# Patient Record
Sex: Male | Born: 1965 | Hispanic: No | Marital: Single | Smoking: Former smoker
Health system: Southern US, Community
[De-identification: ages and names within clinical notes are randomized; demographics above are authoritative.]

## PROBLEM LIST (undated history)

## (undated) DIAGNOSIS — I255 Ischemic cardiomyopathy: Secondary | ICD-10-CM

## (undated) DIAGNOSIS — E785 Hyperlipidemia, unspecified: Secondary | ICD-10-CM

## (undated) DIAGNOSIS — I2119 ST elevation (STEMI) myocardial infarction involving other coronary artery of inferior wall: Secondary | ICD-10-CM

## (undated) DIAGNOSIS — I2510 Atherosclerotic heart disease of native coronary artery without angina pectoris: Secondary | ICD-10-CM

## (undated) DIAGNOSIS — Z87891 Personal history of nicotine dependence: Secondary | ICD-10-CM

## (undated) HISTORY — DX: Personal history of nicotine dependence: Z87.891

## (undated) HISTORY — PX: NO PAST SURGERIES: SHX2092

## (undated) HISTORY — DX: Ischemic cardiomyopathy: I25.5

## (undated) HISTORY — DX: Hyperlipidemia, unspecified: E78.5

---

## 2009-10-27 ENCOUNTER — Emergency Department (HOSPITAL_COMMUNITY): Admission: EM | Admit: 2009-10-27 | Discharge: 2009-10-28 | Payer: Self-pay | Admitting: Emergency Medicine

## 2011-01-29 LAB — DIFFERENTIAL
Basophils Absolute: 0.3 10*3/uL — ABNORMAL HIGH (ref 0.0–0.1)
Basophils Relative: 2 % — ABNORMAL HIGH (ref 0–1)
Monocytes Absolute: 0.3 10*3/uL (ref 0.1–1.0)
Neutro Abs: 13.1 10*3/uL — ABNORMAL HIGH (ref 1.7–7.7)
Neutrophils Relative %: 89 % — ABNORMAL HIGH (ref 43–77)

## 2011-01-29 LAB — COMPREHENSIVE METABOLIC PANEL
BUN: 21 mg/dL (ref 6–23)
CO2: 22 mEq/L (ref 19–32)
Chloride: 108 mEq/L (ref 96–112)
Creatinine, Ser: 1.23 mg/dL (ref 0.4–1.5)
GFR calc non Af Amer: >60 mL/min (ref >60)
Glucose, Bld: 137 mg/dL — ABNORMAL HIGH (ref 70–99)
Total Bilirubin: 0.7 mg/dL (ref 0.3–1.2)

## 2011-01-29 LAB — POCT CARDIAC MARKERS
CKMB, poc: <1 ng/mL — ABNORMAL LOW (ref 1.0–8.0)
CKMB, poc: <1 ng/mL — ABNORMAL LOW (ref 1.0–8.0)
Myoglobin, poc: 84.3 ng/mL (ref 12–200)
Myoglobin, poc: 95.5 ng/mL (ref 12–200)
Troponin i, poc: <0.05 ng/mL (ref 0.00–0.09)
Troponin i, poc: <0.05 ng/mL (ref 0.00–0.09)

## 2011-01-29 LAB — URINALYSIS, ROUTINE W REFLEX MICROSCOPIC
Glucose, UA: NEGATIVE mg/dL
Ketones, ur: NEGATIVE mg/dL
pH: 5.5 (ref 5.0–8.0)

## 2011-01-29 LAB — CBC
HCT: 47.6 % (ref 39.0–52.0)
Hemoglobin: 16 g/dL (ref 13.0–17.0)
WBC: 14.9 10*3/uL — ABNORMAL HIGH (ref 4.0–10.5)

## 2015-08-31 ENCOUNTER — Encounter (HOSPITAL_COMMUNITY): Payer: Self-pay | Admitting: Cardiovascular Disease

## 2015-08-31 ENCOUNTER — Inpatient Hospital Stay (HOSPITAL_COMMUNITY)
Admission: RE | Admit: 2015-08-31 | Discharge: 2015-09-03 | DRG: 247 | Disposition: A | Discharge status: Home / Self Care | Payer: PRIVATE HEALTH INSURANCE | Source: Other Acute Inpatient Hospital | Attending: Cardiovascular Disease | Admitting: Cardiovascular Disease

## 2015-08-31 ENCOUNTER — Encounter (HOSPITAL_COMMUNITY)
Admission: RE | Disposition: A | Discharge status: Home / Self Care | Payer: PRIVATE HEALTH INSURANCE | Source: Other Acute Inpatient Hospital | Attending: Cardiovascular Disease

## 2015-08-31 ENCOUNTER — Inpatient Hospital Stay (HOSPITAL_COMMUNITY): Payer: PRIVATE HEALTH INSURANCE

## 2015-08-31 DIAGNOSIS — I213 ST elevation (STEMI) myocardial infarction of unspecified site: Secondary | ICD-10-CM | POA: Diagnosis present

## 2015-08-31 DIAGNOSIS — I472 Ventricular tachycardia: Secondary | ICD-10-CM | POA: Diagnosis not present

## 2015-08-31 DIAGNOSIS — E785 Hyperlipidemia, unspecified: Secondary | ICD-10-CM | POA: Diagnosis present

## 2015-08-31 DIAGNOSIS — I2111 ST elevation (STEMI) myocardial infarction involving right coronary artery: Secondary | ICD-10-CM | POA: Diagnosis not present

## 2015-08-31 DIAGNOSIS — I255 Ischemic cardiomyopathy: Secondary | ICD-10-CM | POA: Diagnosis present

## 2015-08-31 DIAGNOSIS — W19XXXA Unspecified fall, initial encounter: Secondary | ICD-10-CM | POA: Diagnosis present

## 2015-08-31 DIAGNOSIS — I252 Old myocardial infarction: Secondary | ICD-10-CM

## 2015-08-31 DIAGNOSIS — Z955 Presence of coronary angioplasty implant and graft: Secondary | ICD-10-CM

## 2015-08-31 DIAGNOSIS — I25110 Atherosclerotic heart disease of native coronary artery with unstable angina pectoris: Secondary | ICD-10-CM | POA: Diagnosis not present

## 2015-08-31 DIAGNOSIS — I2119 ST elevation (STEMI) myocardial infarction involving other coronary artery of inferior wall: Secondary | ICD-10-CM | POA: Diagnosis present

## 2015-08-31 DIAGNOSIS — Z23 Encounter for immunization: Secondary | ICD-10-CM | POA: Diagnosis not present

## 2015-08-31 DIAGNOSIS — R51 Headache: Secondary | ICD-10-CM

## 2015-08-31 DIAGNOSIS — I2510 Atherosclerotic heart disease of native coronary artery without angina pectoris: Secondary | ICD-10-CM

## 2015-08-31 DIAGNOSIS — I200 Unstable angina: Secondary | ICD-10-CM

## 2015-08-31 DIAGNOSIS — Z87891 Personal history of nicotine dependence: Secondary | ICD-10-CM

## 2015-08-31 DIAGNOSIS — R55 Syncope and collapse: Secondary | ICD-10-CM | POA: Diagnosis present

## 2015-08-31 DIAGNOSIS — R519 Headache, unspecified: Secondary | ICD-10-CM

## 2015-08-31 HISTORY — PX: CARDIAC CATHETERIZATION: SHX172

## 2015-08-31 HISTORY — DX: Atherosclerotic heart disease of native coronary artery without angina pectoris: I25.10

## 2015-08-31 HISTORY — DX: ST elevation (STEMI) myocardial infarction involving other coronary artery of inferior wall: I21.19

## 2015-08-31 LAB — COMPREHENSIVE METABOLIC PANEL
ALBUMIN: 3.5 g/dL (ref 3.5–5.0)
ALK PHOS: 70 U/L (ref 38–126)
ALT: 26 U/L (ref 17–63)
ANION GAP: 10 (ref 5–15)
AST: 39 U/L (ref 15–41)
BILIRUBIN TOTAL: 0.5 mg/dL (ref 0.3–1.2)
BUN: 14 mg/dL (ref 6–20)
CALCIUM: 8.5 mg/dL — AB (ref 8.9–10.3)
CO2: 24 mmol/L (ref 22–32)
CREATININE: 0.9 mg/dL (ref 0.61–1.24)
Chloride: 106 mmol/L (ref 101–111)
GFR calc non Af Amer: >60 mL/min (ref >60)
GLUCOSE: 121 mg/dL — AB (ref 65–99)
Potassium: 3.9 mmol/L (ref 3.5–5.1)
SODIUM: 140 mmol/L (ref 135–145)
Total Protein: 6.6 g/dL (ref 6.5–8.1)

## 2015-08-31 LAB — POCT I-STAT, CHEM 8
BUN: 17 mg/dL (ref 6–20)
CALCIUM ION: 1.15 mmol/L (ref 1.12–1.23)
Chloride: 100 mmol/L — ABNORMAL LOW (ref 101–111)
Creatinine, Ser: 0.8 mg/dL (ref 0.61–1.24)
GLUCOSE: 103 mg/dL — AB (ref 65–99)
HCT: 38 % — ABNORMAL LOW (ref 39.0–52.0)
Hemoglobin: 12.9 g/dL — ABNORMAL LOW (ref 13.0–17.0)
POTASSIUM: 3 mmol/L — AB (ref 3.5–5.1)
SODIUM: 138 mmol/L (ref 135–145)
TCO2: 23 mmol/L (ref 0–100)

## 2015-08-31 LAB — CBC WITH DIFFERENTIAL/PLATELET
Basophils Absolute: 0 10*3/uL (ref 0.0–0.1)
Basophils Relative: 0 %
Eosinophils Absolute: 0.1 10*3/uL (ref 0.0–0.7)
Eosinophils Relative: 1 %
HEMATOCRIT: 43.3 % (ref 39.0–52.0)
HEMOGLOBIN: 14 g/dL (ref 13.0–17.0)
LYMPHS ABS: 1.7 10*3/uL (ref 0.7–4.0)
Lymphocytes Relative: 11 %
MCH: 26.3 pg (ref 26.0–34.0)
MCHC: 32.3 g/dL (ref 30.0–36.0)
MCV: 81.4 fL (ref 78.0–100.0)
MONOS PCT: 3 %
Monocytes Absolute: 0.5 10*3/uL (ref 0.1–1.0)
NEUTROS ABS: 12.5 10*3/uL — AB (ref 1.7–7.7)
NEUTROS PCT: 85 %
Platelets: 200 10*3/uL (ref 150–400)
RBC: 5.32 MIL/uL (ref 4.22–5.81)
RDW: 13.2 % (ref 11.5–15.5)
WBC: 14.8 10*3/uL — ABNORMAL HIGH (ref 4.0–10.5)

## 2015-08-31 LAB — TSH: TSH: 0.919 u[IU]/mL (ref 0.350–4.500)

## 2015-08-31 LAB — BRAIN NATRIURETIC PEPTIDE: B Natriuretic Peptide: 9.7 pg/mL (ref 0.0–100.0)

## 2015-08-31 LAB — POCT ACTIVATED CLOTTING TIME: ACTIVATED CLOTTING TIME: 706 s

## 2015-08-31 LAB — TROPONIN I
Troponin I: 4.52 ng/mL (ref <0.031)
Troponin I: 19.22 ng/mL (ref <0.031)

## 2015-08-31 LAB — MRSA PCR SCREENING: MRSA by PCR: NEGATIVE

## 2015-08-31 SURGERY — LEFT HEART CATH AND CORONARY ANGIOGRAPHY
Anesthesia: LOCAL

## 2015-08-31 MED ORDER — MIDAZOLAM HCL 2 MG/2ML IJ SOLN
INTRAMUSCULAR | Status: AC
  Filled 2015-08-31: qty 4

## 2015-08-31 MED ORDER — SODIUM CHLORIDE 0.9 % IJ SOLN
3.0000 mL | Freq: Two times a day (BID) | INTRAMUSCULAR | Status: DC
  Administered 2015-08-31 – 2015-09-03 (×6): 3 mL via INTRAVENOUS

## 2015-08-31 MED ORDER — NITROGLYCERIN 0.4 MG SL SUBL
0.4000 mg | SUBLINGUAL_TABLET | SUBLINGUAL | Status: DC | PRN
  Administered 2015-09-02 (×2): 0.4 mg via SUBLINGUAL
  Filled 2015-08-31 (×2): qty 1

## 2015-08-31 MED ORDER — HEPARIN SODIUM (PORCINE) 5000 UNIT/ML IJ SOLN
5000.0000 [IU] | Freq: Three times a day (TID) | INTRAMUSCULAR | Status: DC
  Administered 2015-09-01 – 2015-09-03 (×5): 5000 [IU] via SUBCUTANEOUS
  Filled 2015-08-31 (×5): qty 1

## 2015-08-31 MED ORDER — ATORVASTATIN CALCIUM 80 MG PO TABS
80.0000 mg | ORAL_TABLET | Freq: Every day | ORAL | Status: DC
  Administered 2015-08-31 – 2015-09-02 (×3): 80 mg via ORAL
  Filled 2015-08-31 (×3): qty 1

## 2015-08-31 MED ORDER — DOPAMINE-DEXTROSE 3.2-5 MG/ML-% IV SOLN
2.0000 ug/kg/min | INTRAVENOUS | Status: DC
  Administered 2015-08-31: 5 ug/kg/min via INTRAVENOUS

## 2015-08-31 MED ORDER — HEPARIN (PORCINE) IN NACL 2-0.9 UNIT/ML-% IJ SOLN
INTRAMUSCULAR | Status: AC
  Filled 2015-08-31: qty 1000

## 2015-08-31 MED ORDER — SODIUM CHLORIDE 0.9 % IV SOLN
250.0000 mg | INTRAVENOUS | Status: DC | PRN
  Administered 2015-08-31: 0.25 mg/kg/h via INTRAVENOUS

## 2015-08-31 MED ORDER — ONDANSETRON HCL 4 MG/2ML IJ SOLN: 4.0000 mg | Freq: Four times a day (QID) | INTRAMUSCULAR | Status: DC | PRN

## 2015-08-31 MED ORDER — SODIUM CHLORIDE 0.9 % IJ SOLN: 3.0000 mL | INTRAMUSCULAR | Status: DC | PRN

## 2015-08-31 MED ORDER — MORPHINE SULFATE (PF) 2 MG/ML IV SOLN
2.0000 mg | INTRAVENOUS | Status: DC | PRN
  Administered 2015-09-02: 2 mg via INTRAVENOUS
  Filled 2015-08-31: qty 1

## 2015-08-31 MED ORDER — METOPROLOL TARTRATE 12.5 MG HALF TABLET
12.5000 mg | ORAL_TABLET | Freq: Two times a day (BID) | ORAL | Status: DC
  Administered 2015-08-31 – 2015-09-03 (×6): 12.5 mg via ORAL
  Filled 2015-08-31 (×6): qty 1

## 2015-08-31 MED ORDER — SODIUM CHLORIDE 0.9 % IV SOLN: 250.0000 mL | INTRAVENOUS | Status: DC | PRN

## 2015-08-31 MED ORDER — TIROFIBAN HCL IN NACL 5-0.9 MG/100ML-% IV SOLN
INTRAVENOUS | Status: AC
  Filled 2015-08-31: qty 100

## 2015-08-31 MED ORDER — ATROPINE SULFATE 0.1 MG/ML IJ SOLN
INTRAMUSCULAR | Status: AC
  Filled 2015-08-31: qty 10

## 2015-08-31 MED ORDER — ATROPINE SULFATE 0.1 MG/ML IJ SOLN
INTRAMUSCULAR | Status: DC | PRN
  Administered 2015-08-31: 1 mg via INTRAVENOUS

## 2015-08-31 MED ORDER — TICAGRELOR 90 MG PO TABS
90.0000 mg | ORAL_TABLET | Freq: Two times a day (BID) | ORAL | Status: DC
  Administered 2015-08-31 – 2015-09-03 (×6): 90 mg via ORAL
  Filled 2015-08-31 (×6): qty 1

## 2015-08-31 MED ORDER — ACETAMINOPHEN 325 MG PO TABS
650.0000 mg | ORAL_TABLET | ORAL | Status: DC | PRN
  Administered 2015-09-02: 650 mg via ORAL
  Filled 2015-08-31: qty 2

## 2015-08-31 MED ORDER — FENTANYL CITRATE (PF) 100 MCG/2ML IJ SOLN
INTRAMUSCULAR | Status: DC | PRN
  Administered 2015-08-31: 25 ug via INTRAVENOUS

## 2015-08-31 MED ORDER — TICAGRELOR 90 MG PO TABS
ORAL_TABLET | ORAL | Status: AC
  Filled 2015-08-31: qty 2

## 2015-08-31 MED ORDER — TIROFIBAN HCL IN NACL 5-0.9 MG/100ML-% IV SOLN
INTRAVENOUS | Status: DC | PRN
  Administered 2015-08-31: 0.15 ug/kg/min via INTRAVENOUS

## 2015-08-31 MED ORDER — VERAPAMIL HCL 2.5 MG/ML IV SOLN
INTRAVENOUS | Status: AC
  Filled 2015-08-31: qty 2

## 2015-08-31 MED ORDER — ACETAMINOPHEN 325 MG PO TABS: 650.0000 mg | ORAL_TABLET | ORAL | Status: DC | PRN

## 2015-08-31 MED ORDER — INFLUENZA VAC SPLIT QUAD 0.5 ML IM SUSY
0.5000 mL | PREFILLED_SYRINGE | INTRAMUSCULAR | Status: AC
Start: 2015-09-01 — End: 2015-09-02
  Administered 2015-09-02: 0.5 mL via INTRAMUSCULAR
  Filled 2015-08-31: qty 0.5

## 2015-08-31 MED ORDER — HEPARIN SODIUM (PORCINE) 1000 UNIT/ML IJ SOLN
INTRAMUSCULAR | Status: DC | PRN
  Administered 2015-08-31: 4000 [IU] via INTRAVENOUS

## 2015-08-31 MED ORDER — TICAGRELOR 90 MG PO TABS
ORAL_TABLET | ORAL | Status: DC | PRN
  Administered 2015-08-31: 180 mg via ORAL

## 2015-08-31 MED ORDER — FENTANYL CITRATE (PF) 100 MCG/2ML IJ SOLN
INTRAMUSCULAR | Status: AC
  Filled 2015-08-31: qty 4

## 2015-08-31 MED ORDER — DOPAMINE-DEXTROSE 3.2-5 MG/ML-% IV SOLN
INTRAVENOUS | Status: AC
  Filled 2015-08-31: qty 250

## 2015-08-31 MED ORDER — OXYCODONE-ACETAMINOPHEN 5-325 MG PO TABS: 1.0000 | ORAL_TABLET | ORAL | Status: DC | PRN

## 2015-08-31 MED ORDER — MIDAZOLAM HCL 2 MG/2ML IJ SOLN
INTRAMUSCULAR | Status: DC | PRN
  Administered 2015-08-31: 2 mg via INTRAVENOUS

## 2015-08-31 MED ORDER — DOPAMINE-DEXTROSE 3.2-5 MG/ML-% IV SOLN
INTRAVENOUS | Status: DC | PRN
  Administered 2015-08-31: 5 ug/kg/min via INTRAVENOUS

## 2015-08-31 MED ORDER — LIDOCAINE HCL (PF) 1 % IJ SOLN
INTRAMUSCULAR | Status: AC
  Filled 2015-08-31: qty 30

## 2015-08-31 MED ORDER — NITROGLYCERIN 1 MG/10 ML FOR IR/CATH LAB
INTRA_ARTERIAL | Status: AC
  Filled 2015-08-31: qty 10

## 2015-08-31 MED ORDER — SODIUM CHLORIDE 0.9 % IV SOLN
INTRAVENOUS | Status: AC
  Administered 2015-08-31: 16:00:00 via INTRAVENOUS

## 2015-08-31 MED ORDER — ASPIRIN 81 MG PO CHEW
81.0000 mg | CHEWABLE_TABLET | Freq: Every day | ORAL | Status: DC
  Administered 2015-09-01 – 2015-09-03 (×3): 81 mg via ORAL
  Filled 2015-08-31 (×2): qty 1

## 2015-08-31 MED ORDER — HEPARIN SODIUM (PORCINE) 1000 UNIT/ML IJ SOLN
INTRAMUSCULAR | Status: AC
  Filled 2015-08-31: qty 1

## 2015-08-31 SURGICAL SUPPLY — 18 items
BALLN EUPHORA RX 2.25X12 (BALLOONS) ×2
BALLOON EUPHORA RX 2.25X12 (BALLOONS) ×1 IMPLANT
CATH EXTRAC PRONTO 5.5F 138CM (CATHETERS) ×2 IMPLANT
CATH INFINITI 5 FR JL3.5 (CATHETERS) ×2 IMPLANT
CATH INFINITI 5FR ANG PIGTAIL (CATHETERS) ×2 IMPLANT
CATH INFINITI JR4 5F (CATHETERS) ×2 IMPLANT
CATH VISTA GUIDE 6FR JR4 (CATHETERS) ×2 IMPLANT
DEVICE RAD COMP TR BAND LRG (VASCULAR PRODUCTS) ×2 IMPLANT
GLIDESHEATH SLEND SS 6F .021 (SHEATH) ×2 IMPLANT
KIT ENCORE 26 ADVANTAGE (KITS) ×2 IMPLANT
KIT HEART LEFT (KITS) ×2 IMPLANT
PACK CARDIAC CATHETERIZATION (CUSTOM PROCEDURE TRAY) ×2 IMPLANT
STENT RESOLUTE INTEG 4.0X18 (Permanent Stent) ×2 IMPLANT
SYR MEDRAD MARK V 150ML (SYRINGE) ×2 IMPLANT
TRANSDUCER W/STOPCOCK (MISCELLANEOUS) ×2 IMPLANT
TUBING CIL FLEX 10 FLL-RA (TUBING) ×2 IMPLANT
WIRE COUGAR XT STRL 190CM (WIRE) ×2 IMPLANT
WIRE SAFE-T 1.5MM-J .035X260CM (WIRE) ×2 IMPLANT

## 2015-08-31 NOTE — H&P (Addendum)
Patient ID: Caleb [name] MRN: [id number] DOB/AGE: 1966/10/24 49 y.o. Admit date: 08/31/2015  Primary Care Physician:No primary care provider on file. Primary Cardiologist: Unknown   HPI: Caleb [name] is a 49 year old [place] male with no known past medical history who presented to the ED as code STEMI. History was collected primarily from EMS and through his co-worker as an interpreter was unavailable. Per EMS, he complained of chest pain before sustaining a brief syncopal episode at work today and hit his head on a steel plate. He reported chest pain and some L arm pain. In the ambulance, he received morphine , NG x 3, ASA  x 4, and 4L O2. Per his friend, he had a prior MI in the past though unclear where he was hospitalized but no ongoing smoking/alcohol use or current medications. Unknown family history of cardiac disease. EKG on admission was notable for ST elevations in the inferior leads.   Post-cath, history was collected with the aid of an interpreter. He reports having ongoing chest pain since Sunday which was made worse with exertion, particularly at work, and relieved with rest. This pain was also associated with nausea and dizziness. He was in the process of collecting something off the machine when his pain worsened to the point where he fell down though cannot recall if he hit his head or not. He denies prior history of this pain or prior MI. He denies any prior surgeries or hospitalizations. He has lived in the [place] since 2005 and used to smoke cigarettes for 13-14 years and drink occasionally before foregoing it all for his faith. His family recently moved a few months ago from [place].   No past medical history on file.  No past surgical history on file.  No family history on file.   Social History   Social History  . Marital Status: Single    Spouse Name: N/A  . Number of Children: N/A  . Years of Education: N/A   Occupational History  . Not on file.   Social  History Main Topics  . Smoking status: Not on file  . Smokeless tobacco: Not on file  . Alcohol Use: Not on file  . Drug Use: Not on file  . Sexual Activity: Not on file   Other Topics Concern  . Not on file   Social History Narrative  . No narrative on file     Prior to Admission medications   Not on File    HOSPITAL MEDICATIONS:   . bivalirudin (ANGIOMAX) infusion 5 mg/mL 0.25 mg/kg/hr (08/31/15 1413)  . tirofiban 0.075 mcg/kg/min (08/31/15 1425)    ROS:  General: no fevers/chills/night sweats/weight loss Eyes: no blurry vision, diplopia, or amaurosis ENT: no sore throat or hearing loss Resp: no cough, wheezing, or hemoptysis CV: no edema or palpitations GI: nausea since Sunday though no abdominal pain, vomiting, diarrhea, or constipation GU: no dysuria, frequency, or hematuria Skin: no rash Neuro: dizziness, no headache, numbness, tingling, or weakness of extremities Musculoskeletal: no joint pain or swelling Heme: no bleeding, DVT, or easy bruising Endo: no polydipsia or polyuria  Physical Exam: SpO2 98 %.    Pt appears alert and oriented, WD, WN, in no distress.  HEENT: normal Neck: JVP normal. Carotid upstrokes normal without bruits. No thyromegaly. Lungs: equal expansion, clear bilaterally CV: Apex is discrete and nondisplaced, RRR without murmur or gallop Abd: soft, NT, +BS, no bruit, no hepatosplenomegaly Back: no CVA tenderness Ext: no C/C/E  Femoral pulses 2+= without bruits        DP/PT pulses intact and = Skin: warm and dry without rash Neuro: CNII-XII intact             Strength intact = bilaterally   EKG: ST elevations in II, III, avF  ASSESSMENT AND PLAN: 49 year old [place] male who presented with inferior STEMI. Plan for emergent cath and dispo pending results.  SignedHeywood Iles: Rosland Riding 08/31/2015, 2:26 PM  Patient seen, examined. Available data reviewed. Agree with findings, assessment, and plan as outlined by Dr Allena KatzPatel. The  patient is having an inferior wall STEMI and is brought directly to the cardiac catheterization lab for catheterization and PCI. Family history, social history, past medical history, and review of systems are unobtainable at this time because of language barrier. The patient speaks no English at all. There is a friend here who also speaks very limited [place]. We are working on getting a Nurse, learning disabilitytranslator to help with his evaluation.  Tonny BollmanMichael Cooper, M.D. 08/31/2015 6:32 PM

## 2015-09-01 ENCOUNTER — Encounter (HOSPITAL_COMMUNITY): Payer: Self-pay | Admitting: Cardiovascular Disease

## 2015-09-01 LAB — BASIC METABOLIC PANEL
ANION GAP: 8 (ref 5–15)
BUN: 14 mg/dL (ref 6–20)
CALCIUM: 8.9 mg/dL (ref 8.9–10.3)
CO2: 26 mmol/L (ref 22–32)
Chloride: 105 mmol/L (ref 101–111)
Creatinine, Ser: 0.92 mg/dL (ref 0.61–1.24)
GFR calc Af Amer: >60 mL/min (ref >60)
GLUCOSE: 100 mg/dL — AB (ref 65–99)
POTASSIUM: 3.8 mmol/L (ref 3.5–5.1)
SODIUM: 139 mmol/L (ref 135–145)

## 2015-09-01 LAB — CBC
HEMATOCRIT: 41.6 % (ref 39.0–52.0)
HEMOGLOBIN: 13.2 g/dL (ref 13.0–17.0)
MCH: 26.1 pg (ref 26.0–34.0)
MCHC: 31.7 g/dL (ref 30.0–36.0)
MCV: 82.2 fL (ref 78.0–100.0)
Platelets: 188 10*3/uL (ref 150–400)
RBC: 5.06 MIL/uL (ref 4.22–5.81)
RDW: 13.6 % (ref 11.5–15.5)
WBC: 9.5 10*3/uL (ref 4.0–10.5)

## 2015-09-01 LAB — LIPID PANEL
CHOLESTEROL: 262 mg/dL — AB (ref 0–200)
HDL: 37 mg/dL — AB (ref >40)
LDL Cholesterol: 194 mg/dL — ABNORMAL HIGH (ref 0–99)
TRIGLYCERIDES: 155 mg/dL — AB (ref <150)
Total CHOL/HDL Ratio: 7.1 RATIO
VLDL: 31 mg/dL (ref 0–40)

## 2015-09-01 LAB — HEMOGLOBIN A1C
Hgb A1c MFr Bld: 5.2 % (ref 4.8–5.6)
Mean Plasma Glucose: 103 mg/dL

## 2015-09-01 LAB — TROPONIN I: Troponin I: 16.16 ng/mL (ref <0.031)

## 2015-09-01 MED ORDER — PNEUMOCOCCAL VAC POLYVALENT 25 MCG/0.5ML IJ INJ: 0.5000 mL | INJECTION | INTRAMUSCULAR | Status: DC

## 2015-09-01 MED ORDER — POTASSIUM CHLORIDE ER 10 MEQ PO TBCR
20.0000 meq | EXTENDED_RELEASE_TABLET | Freq: Once | ORAL | Status: AC
  Administered 2015-09-01: 20 meq via ORAL
  Filled 2015-09-01 (×2): qty 2

## 2015-09-01 NOTE — Progress Notes (Signed)
Dr. Allena KatzPatel at bedside with interpreter, [facility]. Reviewed and updated H&P. Consent signed and confirmed via interpreter by Dr. Allena KatzPatel and Hillery Jacksebecca Cartha Rotert, RN. All questions and concerns addressed and answered appropriately. Dr. Duke Salviaandolph at bedside also confirming cath procedure for tomorrow 09/02/15 (see signed consent).

## 2015-09-01 NOTE — Progress Notes (Signed)
Attempted several times to get in touch with interpreter with the number that was listed in chart, unable to get a hold of them, the line was busy.  Tried calling [place] interpreters to reach via phone they did not have specific dialect of pt. Will continue to try and get in touch with interpreter.

## 2015-09-01 NOTE — Care Management Note (Signed)
Case Management Note  Patient Details  Name: Caleb [name] MRN: [id number] Date of Birth: 11/14/1965  Subjective/Objective:     Adm w mi               Action/Plan:lives w fam   Expected Discharge Date:                  Expected Discharge Plan:     In-House Referral:     Discharge planning Services     Post Acute Care Choice:    Choice offered to:     DME Arranged:    DME Agency:     HH Arranged:    HH Agency:     Status of Service:     Medicare Important Message Given:    Date Medicare IM Given:    Medicare IM give by:    Date Additional Medicare IM Given:    Additional Medicare Important Message give by:     If discussed at Long Length of Stay Meetings, dates discussed:    Additional Comments:gave pt 30day free and copay assist card for brilinta.  Hanley Haysowell, Hernando Reali T, RN 09/01/2015, 9:55 AM

## 2015-09-01 NOTE — Progress Notes (Signed)
    Subjective:  Wife present at bedside. No interpreter was available to ask questions though denies any pain when asked.   Objective:  Vital Signs in the last 24 hours: Temp:  [98.1 F (36.7 C)-98.8 F (37.1 C)] 98.4 F (36.9 C) (11/03 0727) Pulse Rate:  [54-96] 65 (11/03 0800) Resp:  [8-13] 10 (11/03 0800) BP: (85-172)/(63-116) 114/82 mmHg (11/03 0800) SpO2:  [98 %-100 %] 100 % (11/03 0800) Weight:  [130 lb 8.2 oz (59.2 kg)] 130 lb 8.2 oz (59.2 kg) (11/02 1500)  Intake/Output from previous day: 11/02 0701 - 11/03 0700 In: 372.2 [P.O.:60; I.V.:312.2] Out: -   Physical Exam: Pt is alert and oriented, NAD, 2L O2 by Morse HEENT: normal Neck: JVP - normal Lungs: CTA bilaterally CV: RRR without murmur or gallop Abd: soft, NT, Positive BS, no hepatomegaly Ext: no C/C/E, distal pulses intact and equal Skin: warm/dry no rash   Lab Results:  Recent Labs  08/31/15 1608 09/01/15 0312  WBC 14.8* 9.5  HGB 14.0 13.2  PLT 200 188    Recent Labs  08/31/15 1608 09/01/15 0312  NA 140 139  K 3.9 3.8  CL 106 105  CO2 24 26  GLUCOSE 121* 100*  BUN 14 14  CREATININE 0.90 0.92    Recent Labs  08/31/15 2052 09/01/15 0312  TROPONINI 19.22* 16.16*    Cardiac Studies: Procedures    Coronary Stent Intervention   Left Heart Cath and Coronary Angiography      Conclusion     Prox LAD to Mid LAD lesion, 90% stenosed.  Dist LAD lesion, 70% stenosed.  1st Mrg lesion, 90% stenosed.  The left ventricular systolic function is normal.  Dist RCA lesion, 100% stenosed. Post intervention, there is a 0% residual stenosis.  1. Acute inferior MI secondary total occlusion of a large, dominant RCA, treated successfully with primary PCI  2. Severe mid LAD stenosis  3. Severe stenosis of a small obtuse marginal branch of the circumflex  4. Preserved/normal LV systolic function   Assessment/Plan:  49 year old [place] male who presented with inferior STEMI now s/p DES  to distal RCA found to have severe stenosis of LAD.   Inferior STEMI s/p DES to distal RCA: ST elevations of inferior leads on admission. Repeat EKG this AM with Q waves and T-wave inversions.  -Continue metoprolol 12.5mg  twice daily, ASA 81mg , Brillinta 90mg  -Plan for intervention of LAD on Friday, 11/5 -Repeat echo following intervention  Hyperlipidemia: Total cholesterol 262, LDL 194. -Continue Lipitor 80 mg.   Heywood Ilesushil Patel, PGY2 Internal Medicine Pager: [phone]

## 2015-09-02 ENCOUNTER — Inpatient Hospital Stay (HOSPITAL_COMMUNITY): Payer: PRIVATE HEALTH INSURANCE

## 2015-09-02 ENCOUNTER — Encounter (HOSPITAL_COMMUNITY): Payer: Self-pay | Admitting: Interventional Cardiology

## 2015-09-02 ENCOUNTER — Encounter (HOSPITAL_COMMUNITY)
Admission: RE | Disposition: A | Discharge status: Home / Self Care | Payer: Self-pay | Source: Other Acute Inpatient Hospital | Attending: Cardiovascular Disease

## 2015-09-02 DIAGNOSIS — I25110 Atherosclerotic heart disease of native coronary artery with unstable angina pectoris: Secondary | ICD-10-CM

## 2015-09-02 DIAGNOSIS — I2510 Atherosclerotic heart disease of native coronary artery without angina pectoris: Secondary | ICD-10-CM

## 2015-09-02 DIAGNOSIS — I200 Unstable angina: Secondary | ICD-10-CM

## 2015-09-02 HISTORY — PX: CARDIAC CATHETERIZATION: SHX172

## 2015-09-02 LAB — PROTIME-INR
INR: 1.03 (ref 0.00–1.49)
Prothrombin Time: 13.7 seconds (ref 11.6–15.2)

## 2015-09-02 LAB — CBC
HEMATOCRIT: 44.7 % (ref 39.0–52.0)
Hemoglobin: 14.4 g/dL (ref 13.0–17.0)
MCH: 26.7 pg (ref 26.0–34.0)
MCHC: 32.2 g/dL (ref 30.0–36.0)
MCV: 82.8 fL (ref 78.0–100.0)
PLATELETS: 213 10*3/uL (ref 150–400)
RBC: 5.4 MIL/uL (ref 4.22–5.81)
RDW: 13.9 % (ref 11.5–15.5)
WBC: 8.2 10*3/uL (ref 4.0–10.5)

## 2015-09-02 LAB — HEPATIC FUNCTION PANEL
ALK PHOS: 61 U/L (ref 38–126)
ALT: 26 U/L (ref 17–63)
AST: 45 U/L — ABNORMAL HIGH (ref 15–41)
Albumin: 3.1 g/dL — ABNORMAL LOW (ref 3.5–5.0)
TOTAL PROTEIN: 6.5 g/dL (ref 6.5–8.1)
Total Bilirubin: 1.1 mg/dL (ref 0.3–1.2)

## 2015-09-02 LAB — BASIC METABOLIC PANEL
Anion gap: 10 (ref 5–15)
BUN: 16 mg/dL (ref 6–20)
CHLORIDE: 104 mmol/L (ref 101–111)
CO2: 25 mmol/L (ref 22–32)
CREATININE: 0.98 mg/dL (ref 0.61–1.24)
Calcium: 8.9 mg/dL (ref 8.9–10.3)
GFR calc Af Amer: >60 mL/min (ref >60)
GFR calc non Af Amer: >60 mL/min (ref >60)
GLUCOSE: 84 mg/dL (ref 65–99)
POTASSIUM: 3.7 mmol/L (ref 3.5–5.1)
Sodium: 139 mmol/L (ref 135–145)

## 2015-09-02 SURGERY — CORONARY STENT INTERVENTION

## 2015-09-02 MED ORDER — SODIUM CHLORIDE 0.9 % WEIGHT BASED INFUSION
3.0000 mL/kg/h | INTRAVENOUS | Status: DC
  Administered 2015-09-02: 3 mL/kg/h via INTRAVENOUS

## 2015-09-02 MED ORDER — FAMOTIDINE IN NACL 20-0.9 MG/50ML-% IV SOLN
INTRAVENOUS | Status: AC
  Filled 2015-09-02: qty 50

## 2015-09-02 MED ORDER — SODIUM CHLORIDE 0.9 % WEIGHT BASED INFUSION
3.0000 mL/kg/h | INTRAVENOUS | Status: AC
  Administered 2015-09-02: 3 mL/kg/h via INTRAVENOUS

## 2015-09-02 MED ORDER — HEPARIN (PORCINE) IN NACL 2-0.9 UNIT/ML-% IJ SOLN
INTRAMUSCULAR | Status: AC
  Filled 2015-09-02: qty 1000

## 2015-09-02 MED ORDER — VERAPAMIL HCL 2.5 MG/ML IV SOLN
INTRAVENOUS | Status: AC
  Filled 2015-09-02: qty 2

## 2015-09-02 MED ORDER — TICAGRELOR 90 MG PO TABS: 90.0000 mg | ORAL_TABLET | Freq: Two times a day (BID) | ORAL | Status: DC

## 2015-09-02 MED ORDER — ASPIRIN 81 MG PO CHEW
81.0000 mg | CHEWABLE_TABLET | ORAL | Status: DC
  Filled 2015-09-02: qty 1

## 2015-09-02 MED ORDER — SODIUM CHLORIDE 0.9 % WEIGHT BASED INFUSION
1.0000 mL/kg/h | INTRAVENOUS | Status: DC
  Administered 2015-09-02: 1 mL via INTRAVENOUS

## 2015-09-02 MED ORDER — FENTANYL CITRATE (PF) 100 MCG/2ML IJ SOLN
INTRAMUSCULAR | Status: DC | PRN
  Administered 2015-09-02 (×2): 25 ug via INTRAVENOUS

## 2015-09-02 MED ORDER — HEPARIN (PORCINE) IN NACL 2-0.9 UNIT/ML-% IJ SOLN
INTRAMUSCULAR | Status: DC | PRN
  Administered 2015-09-02: 12:00:00

## 2015-09-02 MED ORDER — SODIUM CHLORIDE 0.9 % IJ SOLN
3.0000 mL | Freq: Two times a day (BID) | INTRAMUSCULAR | Status: DC
  Administered 2015-09-02 (×2): 3 mL via INTRAVENOUS

## 2015-09-02 MED ORDER — IOHEXOL 350 MG/ML SOLN
INTRAVENOUS | Status: DC | PRN
  Administered 2015-09-02: 75 mL via INTRA_ARTERIAL

## 2015-09-02 MED ORDER — SODIUM CHLORIDE 0.9 % IJ SOLN
3.0000 mL | Freq: Two times a day (BID) | INTRAMUSCULAR | Status: DC
  Administered 2015-09-02 – 2015-09-03 (×2): 3 mL via INTRAVENOUS

## 2015-09-02 MED ORDER — GI COCKTAIL ~~LOC~~
30.0000 mL | Freq: Two times a day (BID) | ORAL | Status: DC | PRN
  Administered 2015-09-02: 30 mL via ORAL
  Filled 2015-09-02: qty 30

## 2015-09-02 MED ORDER — LIDOCAINE HCL (PF) 1 % IJ SOLN
INTRAMUSCULAR | Status: AC
  Filled 2015-09-02: qty 30

## 2015-09-02 MED ORDER — NITROGLYCERIN 1 MG/10 ML FOR IR/CATH LAB
INTRA_ARTERIAL | Status: DC | PRN
  Administered 2015-09-02: 100 ug

## 2015-09-02 MED ORDER — SODIUM CHLORIDE 0.9 % IJ SOLN: 3.0000 mL | INTRAMUSCULAR | Status: DC | PRN

## 2015-09-02 MED ORDER — SODIUM CHLORIDE 0.9 % IV SOLN
250.0000 mL | INTRAVENOUS | Status: DC | PRN
Start: 2015-09-02 — End: 2015-09-03

## 2015-09-02 MED ORDER — HEPARIN (PORCINE) IN NACL 2-0.9 UNIT/ML-% IJ SOLN
INTRAMUSCULAR | Status: DC | PRN
  Administered 2015-09-02: 12:00:00 via INTRA_ARTERIAL

## 2015-09-02 MED ORDER — LIDOCAINE HCL (PF) 1 % IJ SOLN
INTRAMUSCULAR | Status: DC | PRN
  Administered 2015-09-02: 5 mL

## 2015-09-02 MED ORDER — MIDAZOLAM HCL 2 MG/2ML IJ SOLN
INTRAMUSCULAR | Status: DC | PRN
  Administered 2015-09-02: 2 mg via INTRAVENOUS
  Administered 2015-09-02: 1 mg via INTRAVENOUS

## 2015-09-02 MED ORDER — HEPARIN SODIUM (PORCINE) 1000 UNIT/ML IJ SOLN
INTRAMUSCULAR | Status: DC | PRN
  Administered 2015-09-02: 7000 [IU] via INTRAVENOUS

## 2015-09-02 MED ORDER — ONDANSETRON HCL 4 MG/2ML IJ SOLN: 4.0000 mg | Freq: Four times a day (QID) | INTRAMUSCULAR | Status: DC | PRN

## 2015-09-02 MED ORDER — ACETAMINOPHEN 325 MG PO TABS
650.0000 mg | ORAL_TABLET | ORAL | Status: DC | PRN
  Administered 2015-09-02: 650 mg via ORAL
  Filled 2015-09-02: qty 2

## 2015-09-02 MED ORDER — MIDAZOLAM HCL 2 MG/2ML IJ SOLN
INTRAMUSCULAR | Status: AC
  Filled 2015-09-02: qty 4

## 2015-09-02 MED ORDER — FAMOTIDINE IN NACL 20-0.9 MG/50ML-% IV SOLN
INTRAVENOUS | Status: DC | PRN
  Administered 2015-09-02: 20 mg via INTRAVENOUS

## 2015-09-02 MED ORDER — NITROGLYCERIN 1 MG/10 ML FOR IR/CATH LAB
INTRA_ARTERIAL | Status: AC
  Filled 2015-09-02: qty 10

## 2015-09-02 MED ORDER — LISINOPRIL 2.5 MG PO TABS
2.5000 mg | ORAL_TABLET | Freq: Every day | ORAL | Status: DC
  Administered 2015-09-02: 2.5 mg via ORAL
  Filled 2015-09-02: qty 1

## 2015-09-02 MED ORDER — FENTANYL CITRATE (PF) 100 MCG/2ML IJ SOLN
INTRAMUSCULAR | Status: AC
  Filled 2015-09-02: qty 4

## 2015-09-02 MED ORDER — SODIUM CHLORIDE 0.9 % IV SOLN: 250.0000 mL | INTRAVENOUS | Status: DC | PRN

## 2015-09-02 MED ORDER — ASPIRIN 81 MG PO CHEW: 81.0000 mg | CHEWABLE_TABLET | Freq: Every day | ORAL | Status: DC

## 2015-09-02 MED FILL — Lidocaine HCl Local Preservative Free (PF) Inj 1%: INTRAMUSCULAR | Qty: 30 | Status: AC

## 2015-09-02 MED FILL — Verapamil HCl IV Soln 2.5 MG/ML: INTRAVENOUS | Qty: 2 | Status: AC

## 2015-09-02 MED FILL — Ticagrelor Tab 90 MG: ORAL | Qty: 2 | Status: AC

## 2015-09-02 SURGICAL SUPPLY — 18 items
BALLN EUPHORA RX 2.0X20 (BALLOONS) ×3
BALLN ~~LOC~~ EMERGE MR 3.0X8 (BALLOONS) ×3
BALLN ~~LOC~~ EMERGE MR 3.5X20 (BALLOONS) ×3
BALLOON EUPHORA RX 2.0X20 (BALLOONS) ×1 IMPLANT
BALLOON ~~LOC~~ EMERGE MR 3.0X8 (BALLOONS) ×1 IMPLANT
BALLOON ~~LOC~~ EMERGE MR 3.5X20 (BALLOONS) ×1 IMPLANT
CATH INFINITI JR4 5F (CATHETERS) ×3 IMPLANT
GLIDESHEATH SLEND SS 6F .021 (SHEATH) ×3 IMPLANT
GUIDE CATH RUNWAY 6FR CLS3 (CATHETERS) ×3 IMPLANT
KIT ENCORE 26 ADVANTAGE (KITS) ×3 IMPLANT
KIT HEART LEFT (KITS) ×3 IMPLANT
PACK CARDIAC CATHETERIZATION (CUSTOM PROCEDURE TRAY) ×3 IMPLANT
STENT SYNERGY DES 2.5X12 (Permanent Stent) ×3 IMPLANT
STENT SYNERGY DES 3X38 (Permanent Stent) ×3 IMPLANT
TRANSDUCER W/STOPCOCK (MISCELLANEOUS) ×3 IMPLANT
TUBING CIL FLEX 10 FLL-RA (TUBING) ×3 IMPLANT
WIRE ASAHI PROWATER 180CM (WIRE) ×3 IMPLANT
WIRE SAFE-T 1.5MM-J .035X260CM (WIRE) ×3 IMPLANT

## 2015-09-02 NOTE — Progress Notes (Signed)
  Echocardiogram 2D Echocardiogram has been performed.  Janalyn HarderWest, Deo Mehringer R 09/02/2015, 1:53 PM

## 2015-09-02 NOTE — Progress Notes (Signed)
MD in cath lab notified of right radial sight oozing with increasing hematoma. Orders initiated per Dr. Eldridge DaceVaranasi. Site assessed by Dr. Eldridge DaceVaranasi at bedside. 6 cc's of air in TR band per MD. Vital signs stable site currently Level 2 with pulses and cap refill present. Will continue to monitor closely.

## 2015-09-02 NOTE — Progress Notes (Signed)
    Subjective:  Per RN, he had headache this morning and received Tylenol. No further episodes of chest pain. No issues otherwise.  Objective:  Vital Signs in the last 24 hours: Temp:  [97.5 F (36.4 C)-98.4 F (36.9 C)] 97.5 F (36.4 C) (11/04 0727) Pulse Rate:  [63-82] 72 (11/04 0800) Resp:  [9-20] 12 (11/04 0800) BP: (93-127)/(64-90) 105/79 mmHg (11/04 0800) SpO2:  [96 %-100 %] 98 % (11/04 0800) Weight:  [130 lb 1.1 oz (59 kg)] 130 lb 1.1 oz (59 kg) (11/04 0342)  Intake/Output from previous day: 11/03 0701 - 11/04 0700 In: 659 [P.O.:360; I.V.:299] Out: -   Physical Exam: Pt is alert and oriented, NAD HEENT: normal Neck: no JVP Lungs: CTA bilaterally CV: RRR without murmur or gallop Abd: soft, NT, Positive BS, no hepatomegaly Ext: no C/C/E, distal pulses intact and equal Skin: warm/dry no rash   Lab Results:  Recent Labs  09/01/15 0312 09/02/15 0301  WBC 9.5 8.2  HGB 13.2 14.4  PLT 188 213    Recent Labs  09/01/15 0312 09/02/15 0301  NA 139 139  K 3.8 3.7  CL 105 104  CO2 26 25  GLUCOSE 100* 84  BUN 14 16  CREATININE 0.92 0.98    Recent Labs  08/31/15 2052 09/01/15 0312  TROPONINI 19.22* 16.16*    Cardiac Studies: Procedures    Coronary Stent Intervention   Left Heart Cath and Coronary Angiography      Conclusion     Prox LAD to Mid LAD lesion, 90% stenosed.  Dist LAD lesion, 70% stenosed.  1st Mrg lesion, 90% stenosed.  The left ventricular systolic function is normal.  Dist RCA lesion, 100% stenosed. Post intervention, there is a 0% residual stenosis.  1. Acute inferior MI secondary total occlusion of a large, dominant RCA, treated successfully with primary PCI  2. Severe mid LAD stenosis  3. Severe stenosis of a small obtuse marginal branch of the circumflex  4. Preserved/normal LV systolic function   Assessment/Plan:  49 year old [place] male who presented with inferior STEMI now s/p DES to distal RCA found to  have severe stenosis of LAD.   Inferior STEMI s/p DES to distal RCA: ST elevations of inferior leads on admission. Repeat EKG this AM with T wave inversions in II, III, aVF and prominent T waves in V2-V5, unchanged from yesterday. -Continue metoprolol 12.5mg  BID, ASA 81mg  daily, Brillinta 90mg  BID -Cath/PCI today -Post-cath, check echo and start lisinopril 2.5mg  -Consult Case Management for medications  Hyperlipidemia: Total cholesterol 262, LDL 194. -Continue Lipitor 80 mg.   Heywood Ilesushil Patel, PGY2 Internal Medicine Pager: [phone]  @TODAY @ 9:39 AM

## 2015-09-02 NOTE — Interval H&P Note (Signed)
Cath Lab Visit (complete for each Cath Lab visit)  Clinical Evaluation Leading to the Procedure:   ACS: Yes.    Non-ACS:    Anginal Classification: CCS IV  Anti-ischemic medical therapy: Maximal Therapy (2 or more classes of medications)  Non-Invasive Test Results: No non-invasive testing performed  Prior CABG: No previous CABG      History and Physical Interval Note:  09/02/2015 11:19 AM  Caleb [name]  has presented today for surgery, with the diagnosis of cad  The various methods of treatment have been discussed with the patient and family. After consideration of risks, benefits and other options for treatment, the patient has consented to  Procedure(s): Coronary Stent Intervention (N/A) as a surgical intervention .  The patient's history has been reviewed, patient examined, no change in status, stable for surgery.  I have reviewed the patient's chart and labs.  Questions were answered to the patient's satisfaction.     Joyceline Maiorino S.

## 2015-09-03 ENCOUNTER — Telehealth: Payer: Self-pay | Admitting: Physician Assistant

## 2015-09-03 DIAGNOSIS — I2510 Atherosclerotic heart disease of native coronary artery without angina pectoris: Secondary | ICD-10-CM

## 2015-09-03 DIAGNOSIS — E785 Hyperlipidemia, unspecified: Secondary | ICD-10-CM | POA: Diagnosis present

## 2015-09-03 DIAGNOSIS — I255 Ischemic cardiomyopathy: Secondary | ICD-10-CM | POA: Diagnosis present

## 2015-09-03 DIAGNOSIS — Z87891 Personal history of nicotine dependence: Secondary | ICD-10-CM

## 2015-09-03 HISTORY — DX: Atherosclerotic heart disease of native coronary artery without angina pectoris: I25.10

## 2015-09-03 LAB — BASIC METABOLIC PANEL
Anion gap: 9 (ref 5–15)
BUN: 15 mg/dL (ref 6–20)
CHLORIDE: 105 mmol/L (ref 101–111)
CO2: 24 mmol/L (ref 22–32)
Calcium: 8.5 mg/dL — ABNORMAL LOW (ref 8.9–10.3)
Creatinine, Ser: 0.96 mg/dL (ref 0.61–1.24)
Glucose, Bld: 92 mg/dL (ref 65–99)
POTASSIUM: 3.6 mmol/L (ref 3.5–5.1)
SODIUM: 138 mmol/L (ref 135–145)

## 2015-09-03 LAB — HEPATIC FUNCTION PANEL
ALK PHOS: 57 U/L (ref 38–126)
ALT: 23 U/L (ref 17–63)
AST: 60 U/L — AB (ref 15–41)
Albumin: 2.8 g/dL — ABNORMAL LOW (ref 3.5–5.0)
Total Bilirubin: 1 mg/dL (ref 0.3–1.2)
Total Protein: 5.9 g/dL — ABNORMAL LOW (ref 6.5–8.1)

## 2015-09-03 LAB — CBC
HCT: 40.9 % (ref 39.0–52.0)
Hemoglobin: 12.9 g/dL — ABNORMAL LOW (ref 13.0–17.0)
MCH: 25.8 pg — ABNORMAL LOW (ref 26.0–34.0)
MCHC: 31.5 g/dL (ref 30.0–36.0)
MCV: 81.8 fL (ref 78.0–100.0)
Platelets: 210 10*3/uL (ref 150–400)
RBC: 5 MIL/uL (ref 4.22–5.81)
RDW: 13.9 % (ref 11.5–15.5)
WBC: 7.7 10*3/uL (ref 4.0–10.5)

## 2015-09-03 MED ORDER — ASPIRIN 81 MG PO CHEW: 81.0000 mg | CHEWABLE_TABLET | Freq: Every day | ORAL | Status: DC

## 2015-09-03 MED ORDER — SIMVASTATIN 40 MG PO TABS: 40.0000 mg | ORAL_TABLET | Freq: Every day | ORAL | Status: DC

## 2015-09-03 MED ORDER — LISINOPRIL 2.5 MG PO TABS
2.5000 mg | ORAL_TABLET | Freq: Every day | ORAL | Status: DC
  Administered 2015-09-03: 2.5 mg via ORAL
  Filled 2015-09-03: qty 1

## 2015-09-03 MED ORDER — LISINOPRIL 2.5 MG PO TABS: 2.5000 mg | ORAL_TABLET | Freq: Every day | ORAL | Status: DC

## 2015-09-03 MED ORDER — TICAGRELOR 90 MG PO TABS
90.0000 mg | ORAL_TABLET | Freq: Two times a day (BID) | ORAL | Status: DC
Start: 2015-09-03 — End: 2015-09-09

## 2015-09-03 MED ORDER — NITROGLYCERIN 0.4 MG SL SUBL: 0.4000 mg | SUBLINGUAL_TABLET | SUBLINGUAL | Status: DC | PRN

## 2015-09-03 MED ORDER — METOPROLOL TARTRATE 25 MG PO TABS
12.5000 mg | ORAL_TABLET | Freq: Two times a day (BID) | ORAL | Status: DC
Start: 2015-09-03 — End: 2018-06-08

## 2015-09-03 NOTE — Discharge Summary (Signed)
Discharge Summary   Patient ID: Caleb [name],  MRN: [id number], DOB/AGE: Apr 09, 1966 49 y.o.  Admit date: 08/31/2015 Discharge date: 09/03/2015  Primary Care Provider: No primary care provider on file. Primary Cardiologist: Dr. Tonny Bollman  Discharge Diagnoses    Principal Problem:   ST elevation myocardial infarction (STEMI) of inferior wall [facility]) Active Problems:   CAD S/P percutaneous coronary angioplasty   Hyperlipidemia   Former tobacco use   Cardiomyopathy, ischemic   Allergies No Known Allergies  Diagnostic Studies/Procedures    Cath this admission. See report. _____________   History of Present Illness  Caleb [name] is a 49 year old [place] male with no known past medical history who presented to the ED as code STEMI. History was collected primarily from EMS and through his co-worker as an interpreter was unavailable. Per EMS, he complained of chest pain before sustaining a brief syncopal episode at work today and hit his head on a steel plate. He reported chest pain and some L arm pain. In the ambulance, he received morphine , NG x 3, ASA  x 4, and 4L O2. Per his friend, he had a prior MI in the past though unclear where he was hospitalized but no ongoing smoking/alcohol use or current medications. Unknown family history of cardiac disease. EKG on admission was notable for ST elevations in the inferior leads.   Post-cath, history was collected with the aid of an interpreter. He reports having ongoing chest pain since Sunday which was made worse with exertion, particularly at work, and relieved with rest. This pain was also associated with nausea and dizziness. He was in the process of collecting something off the machine when his pain worsened to the point where he fell down though cannot recall if he hit his head or not. He denies prior history of this pain or prior MI. He denies any prior surgeries or hospitalizations. He has lived in the [place] since 2005 and used to  smoke cigarettes for 13-14 years and drink occasionally before foregoing it all for his faith. His family recently moved a few months ago from [place].  [facility]   Ischemic cardiomyopathy: On admission, he underwent DES to RCA after being found to have 100% stenosis. Two days later, he then underwent staged PCI with DES to the proximal and distal LAD for 90% and 70% stenosis respectively. Echo thereafter was notable for EF 45-50%, grade 1 diastolic dysfunction, mild LVH. Throughout his admission, he denied any additional chest pain or dyspnea. He was discharged on ASA , Brillinta  twice daily, metoprolol 12.5mg  twice daily, and lisinopril 2.5mg  daily. Instructions were reviewed with the patient and his family with the aid of an interpreter, and he was provided an excuse letter for work. Cardiac rehab was scheduled to see the patient prior to discharge with plan to discharge if he can tolerate it well.  Hyperlipidemia: Lipid panel as noted below. He was initially started on Lipitor  though discharged on Zocor  out of consideration for cost.     Lipid Panel     Component Value Date/Time   CHOL 262* 09/01/2015 0312   TRIG 155* 09/01/2015 0312   HDL 37* 09/01/2015 0312   CHOLHDL 7.1 09/01/2015 0312   VLDL 31 09/01/2015 0312   LDLCALC 194* 09/01/2015 0312      _____________  Discharge Vitals Blood pressure 115/81, pulse 70, temperature 97.9 F (36.6 C), temperature source Oral, resp. rate 13, height  (1.626 m), weight 130 lb 1.1 oz (59  kg), SpO2 97 %.  Filed Weights   08/31/15 1500 09/02/15 0342  Weight: 130 lb 8.2 oz (59.2 kg) 130 lb 1.1 oz (59 kg)   _____________  Labs     CBC  Recent Labs  08/31/15 1608  09/02/15 0301 09/03/15 0245  WBC 14.8*  < > 8.2 7.7  NEUTROABS 12.5*  --   --   --   HGB 14.0  < > 14.4 12.9*  HCT 43.3  < > 44.7 40.9  MCV 81.4  < > 82.8 81.8  PLT 200  < > 213 210  < > = values in this interval not displayed. Basic  Metabolic Panel  Recent Labs  09/02/15 0301 09/03/15 0245  NA 139 138  K 3.7 3.6  CL 104 105  CO2 25 24  GLUCOSE 84 92  BUN 16 15  CREATININE 0.98 0.96  CALCIUM 8.9 8.5*   Liver Function Tests  Recent Labs  09/02/15 1059 09/03/15 0815  AST 45* 60*  ALT 26 23  ALKPHOS 61 57  BILITOT 1.1 1.0  PROT 6.5 5.9*  ALBUMIN 3.1* 2.8*   Cardiac Enzymes  Recent Labs  08/31/15 1608 08/31/15 2052 09/01/15 0312  TROPONINI 4.52* 19.22* 16.16*   Hemoglobin A1C  Recent Labs  08/31/15 1608  HGBA1C 5.2   Fasting Lipid Panel  Recent Labs  09/01/15 0312  CHOL 262*  HDL 37*  LDLCALC 194*  TRIG 155*  CHOLHDL 7.1   Thyroid Function Tests  Recent Labs  08/31/15 1608  TSH 0.919   _____________  Disposition   Pt is being discharged home today in good condition. _____________  Follow-up Plans & Appointments    Follow-up Information    Follow up with Tonny Bollmanooper, Michael, MD.   Specialty:  Cardiology   Why:  Office will call you with appointment.   Contact information:   [address] [place] [id number] [phone]       [facility] with [facility].   Why:  Office will call you with appointment.   Contact information:   [address] [place] [id number] [phone]     Discharge Instructions    Diet - low sodium heart healthy    Complete by:  As directed      Discharge instructions    Complete by:  As directed   No driving for 2 weeks. No lifting over 10 lbs for 4 weeks. No sexual activity for 4 weeks. Please do not return to work until you are cleared by your cardiologist. Keep procedure site clean and dry. If you notice increased pain, swelling, bleeding, or pus, call/return. You may shower but no soaking baths, hot tubs, or pools for 1 week.     Increase activity slowly    Complete by:  As directed            Discharge Medications   Current Discharge Medication List    START taking  these medications   Details  aspirin 81 MG chewable tablet Chew 1 tablet (81 mg total) by mouth daily. Qty: 30 tablet, Refills: 11    lisinopril (PRINIVIL,ZESTRIL) 2.5 MG tablet Take 1 tablet (2.5 mg total) by mouth daily. Qty: 30 tablet, Refills: 11    metoprolol tartrate (LOPRESSOR) 25 MG tablet Take 0.5 tablets (12.5 mg total) by mouth 2 (two) times daily. Qty: 30 tablet, Refills: 11    nitroGLYCERIN (NITROSTAT) 0.4 MG SL tablet Place 1 tablet (0.4 mg total) under  the tongue every 5 (five) minutes as needed for chest pain (Up to 3 doses). Qty: 25 tablet, Refills: 3    simvastatin (ZOCOR) 40 MG tablet Take 1 tablet (40 mg total) by mouth daily at 6 PM. Qty: 30 tablet, Refills: 11    ticagrelor (BRILINTA) 90 MG TABS tablet Take 1 tablet (90 mg total) by mouth 2 (two) times daily. Qty: 60 tablet, Refills: 11        Aspirin prescribed at discharge:  Yes High Intensity Statin Prescribed? (Lipitor 40-80mg  or Crestor 20-40mg ): No due to cost. Beta Blocker Prescribed: Yes ADP Receptor Inhibitor Prescribed? (i.e. Plavix etc.-Includes Medically Managed Patients): Yes For EF <40%, Aldosterone Inhibitor Prescribed? No: EF not <40%. Was EF assessed during THIS hospitalization? Yes Was Cardiac Rehab II ordered? (Included Medically managed Patients): Will try to get rehab to see prior to discharge _____________   Duration of Discharge Encounter   Greater than 30 minutes including physician time.  Signed, Heywood Iles, PGY2  09/03/2015, 10:59 AM  Patient seen and examined with Dr. Allena Katz. We discussed all aspects of the encounter. I agree with the assessment and plan as stated above. He is stable for d/c today with close outpatient f/u. Importance of medication compliance (especially DAPT) explained in detail through an interpreter.   Bensimhon, Daniel,MD 12:30 PM

## 2015-09-03 NOTE — Progress Notes (Signed)
D/C instructions including site care, medications, and emergency care reviewed with patient via interpretor.  Pt has Brillenta card.   MD offices to call for follow up appointment.  PIV sites d/c'd.  Pt discharged via wheelchair per orders.

## 2015-09-03 NOTE — Progress Notes (Signed)
[facility] voiced understanding. Will send referral for G's[facility] CRPII and will give Mae as the contact person (she agreed).  [phone] Ethelda ChickKristan Thomasine Klutts CES, ACSM 2:19 PM 09/03/2015

## 2015-09-03 NOTE — Progress Notes (Signed)
CARDIAC REHAB PHASE I   PRE:  Rate/Rhythm: 74 SR    BP: sitting 131/102, recheck 131/92    SaO2: 99 RA  MODE:  Ambulation: 350 ft   POST:  Rate/Rhythm: 80 SR    BP: sitting 139/85     SaO2:   Tolerated well, no c/o. BP elevated, was lower 1 hr ago per flowsheets. Friend walked with us and did some translation but not enough for medical terms. Attempted to call translator from Internal Medicine however she was not available at this time. Will try back later. Left materials (only Eng available).   [phone]  Elissa LovettReeve, Derrious Bologna [place] CES, ACSM 09/03/2015 12:19 PM

## 2015-09-03 NOTE — Telephone Encounter (Signed)
This note serves as Energy managerplaceholder documentation rather than a telephone call. I had sent this in a staff message to our scheduling department but I just wanted this information to be part of his permanent chart   Patient does not speak English but the cardiology resident Dr. Allena KatzPatel told me that there is a staff member in [facility] who can help translate for him. (It can be hard to match his type of dialect.) She is willing to pass on f/u appointment information to him. Her name is May Peterson Ao(Mei?) and her office number is [phone], and her cell phone number is [phone].   Rayen Dafoe PA-C

## 2015-09-03 NOTE — Progress Notes (Signed)
Subjective:  Wife is at bedside. Now s/p DES x 2 to the LAD in staged fashion. Did not appear to be having chest pain or headache by way of gesturing.  Objective:  Vital Signs in the last 24 hours: Temp:  [98 F (36.7 C)-98.8 F (37.1 C)] 98.8 F (37.1 C) (11/05 0331) Pulse Rate:  [0-93] 74 (11/05 0700) Resp:  [0-21] 12 (11/05 0700) BP: (67-137)/(41-97) 115/82 mmHg (11/05 0700) SpO2:  [96 %-100 %] 98 % (11/05 0700)  Intake/Output from previous day: 11/04 0701 - 11/05 0700 In: 1030.8 [P.O.:240; I.V.:790.8] Out: -    . aspirin  81 mg Oral Daily  . heparin  5,000 Units Subcutaneous 3 times per day  . lisinopril  2.5 mg Oral Daily  . metoprolol tartrate  12.5 mg Oral BID  . pneumococcal 23 valent vaccine  0.5 mL Intramuscular Tomorrow-1000  . simvastatin  40 mg Oral q1800  . sodium chloride  3 mL Intravenous Q12H  . sodium chloride  3 mL Intravenous Q12H  . ticagrelor  90 mg Oral BID     Physical Exam: General: Pt is alert and oriented, NAD HEENT: normal Neck: no JVP Lungs: CTA bilaterally CV: RRR without murmur or gallop Abd: soft, NT, Positive BS, no hepatomegaly Ext: no C/C/E, distal pulses intact and equal Skin: warm/dry no rash   Lab Results:  Recent Labs  09/02/15 0301 09/03/15 0245  WBC 8.2 7.7  HGB 14.4 12.9*  PLT 213 210    Recent Labs  09/02/15 0301 09/03/15 0245  NA 139 138  K 3.7 3.6  CL 104 105  CO2 25 24  GLUCOSE 84 92  BUN 16 15  CREATININE 0.98 0.96    Recent Labs  08/31/15 2052 09/01/15 0312  TROPONINI 19.22* 16.16*    Cardiac Studies: Procedures    Coronary Stent Intervention   Left Heart Cath and Coronary Angiography      Conclusion     Prox LAD to Mid LAD lesion, 90% stenosed.  Dist LAD lesion, 70% stenosed.  1st Mrg lesion, 90% stenosed.  The left ventricular systolic function is normal.  Dist RCA lesion, 100% stenosed. Post intervention, there is a 0% residual stenosis.  1. Acute inferior MI  secondary total occlusion of a large, dominant RCA, treated successfully with primary PCI  2. Severe mid LAD stenosis  3. Severe stenosis of a small obtuse marginal branch of the circumflex  4. Preserved/normal LV systolic function   Assessment/Plan:  49 year old [place] male who presented with inferior STEMI now s/p DES to distal RCA and DES x 2 to the proximal LAD found to have reduced systolic function.   Ischemic cardiomyopathy 2/2 CAD s/p DES to distal RCA and LAD: EF 45-50% with mild LVH and grade 1 diastolic dysfunction on echo yesterday. Repeat EKG this AM with T wave inversions in II, III, aVF and prominent T waves in V2-V5, stable. -Continue metoprolol 12.5mg  BID, ASA 81mg  daily, Brillinta 90mg  BID -Continue lisinopril 2.5mg   Hyperlipidemia: Total cholesterol 262, LDL 194. AST 60 today, up from 39 on admission. -Unable to afford atorva. Use simva  Heywood Ilesushil Patel, PGY2 Internal Medicine Pager: [phone]  @TODAY @ 7:28 AM   Patient seen and examined with Dr. Allena KatzPatel with use of interpreter line. We discussed all aspects of the encounter. I agree with the assessment and plan as stated above.   He is doing well. Now s/p DES to RCA and staged PCI of LAD. Can go home today. Meds reviewed  in detail. Arrange CR. Out of work x 4 weeks.   Unable to afford meds easily. Has Brilinta card. Can switch to Plavix at one month if needed. Risk of stent thrombosis explained in detail through interpreter.   D/c meds Simva 40 Brilinta Asa 81 Carvedilol 3.125 bid Losartan 25   Ketura Sirek,MD 10:30 AM

## 2015-09-05 ENCOUNTER — Telehealth: Payer: Self-pay | Admitting: Cardiovascular Disease

## 2015-09-05 LAB — POCT ACTIVATED CLOTTING TIME
Activated Clotting Time: 300 seconds
Activated Clotting Time: 307 seconds

## 2015-09-05 NOTE — Telephone Encounter (Signed)
[facility]  [facility]11 @ 9:30 w/ Kriste Basqueayna- Confirmed appt w/ May- can call for TCM- as bellow  NOTE: Patient does not speak English but the cardiology resident Dr. Allena KatzPatel told me that there is a staff member in their [facility] who speaks his dialect and can help translate for him. She is willing to pass on the appointment information to him. Her name is May Peterson Ao(Mei?) and her office number is [phone], and her cell phone number is [phone].

## 2015-09-05 NOTE — Telephone Encounter (Signed)
Patient contacted [facility] on [facility].  Spoke with May ([phone]).    Patient understands to follow up with provider--May aware of appt time with Ronie Spiesayna Dunn, PA on 09/09/15 at 9:30 at [facility]  She will make pt aware of appt time Patient understands discharge instructions? Yes.  May reports she went to pt's home on Saturday and went over all instructions with him.  She reports he understands instructions.   Patient understands medications and regiment?  May reports pt understands how to take medications and has all medications.  Patient understands to bring all medications to this visit? May will make pt aware to bring medications to visit.   Pt will need interpreter for office visit.  May reports interpreter should not be Doc.  May requests Jamelle HaringSnow interpret for pt if available.  I [facility] and gave them information regarding interpreter.

## 2015-09-07 DIAGNOSIS — L299 Pruritus, unspecified: Secondary | ICD-10-CM

## 2015-09-07 NOTE — Congregational Nurse Program (Signed)
Congregational Nurse Program Note  Date of Encounter: 09/07/2015  Past Medical History: Past Medical History  Diagnosis Date  . ST elevation myocardial infarction (STEMI) of inferior wall (HCC) 08/31/2015  . Coronary artery disease     Encounter Details:     CNP Questionnaire - 09/07/15 1710    Patient Demographics   Is this a new or existing patient? New   Patient is considered a/an Refugee   Patient Assistance   Patient's financial/insurance status Low Income   Patient referred to apply for the following financial assistance Not Applicable   Food insecurities addressed Not Applicable   Transportation assistance No   Assistance securing medications Yes  Picked up Benadryl for pt. per order Dr. Heywood Ilesushil Patel.   Educational health offerings Medications   Encounter Details   Primary purpose of visit Acute Illness/Condition Visit;Education/Health Concerns;Post Discharge Visit   Was an Emergency Department visit averted? No   Does patient have a medical provider? Yes   Patient referred to Not Applicable   Was a mental health screening completed? (GAINS tool) No   Does patient have dental issues? No   Since previous encounter, have you referred patient for abnormal blood pressure that resulted in a new diagnosis or medication change? No   Since previous encounter, have you referred patient for abnormal blood glucose that resulted in a new diagnosis or medication change? No     Home visit per request of [facility] wellness staff member [employer] and Dr. Heywood Ilesushil Patel.  Pt. Was discharged from hospital on Sat. September 03, 2015.  States that on Monday November 7 he began having generalized itching with hives and erythema.  Itching has continued to worsen.  He has patches of hives on all extremities, neck and back but most concentrated on chest.States he has not changed diet or environmental factors such as detergents,soaps, new clothes or linens or any other possible  exposures. Has not tried anything to alleviate itching. States he has never had any kind of allergic reaction in past and nothing similar to current problem. Has not experienced any swelling of tongue, throat or lips.  Denies SOB.  Also no pain today. States he stopped all meds. Today except Brilinta and ASA. He will take Nitroglycerin if needed for chest pain.  Pt. Was given Benadryl 25 mg po at 2:30 pm per Dr. Allena KatzPatel.  At 3:00 pm itching was lessening.  Also recommended cool compresses.  Advised patient he can take 1 Benadryl every 4 hrs until itching subsides.  Has MD appointment Friday 09/09/2015 with [facility].

## 2015-09-09 ENCOUNTER — Encounter: Payer: Self-pay | Admitting: Physician Assistant

## 2015-09-09 ENCOUNTER — Ambulatory Visit (INDEPENDENT_AMBULATORY_CARE_PROVIDER_SITE_OTHER): Payer: PRIVATE HEALTH INSURANCE | Admitting: Physician Assistant

## 2015-09-09 VITALS — BP 110/60 | HR 87 | Ht 64.0 in | Wt 128.0 lb

## 2015-09-09 DIAGNOSIS — I2510 Atherosclerotic heart disease of native coronary artery without angina pectoris: Secondary | ICD-10-CM | POA: Diagnosis not present

## 2015-09-09 DIAGNOSIS — Z87891 Personal history of nicotine dependence: Secondary | ICD-10-CM | POA: Diagnosis not present

## 2015-09-09 DIAGNOSIS — L509 Urticaria, unspecified: Secondary | ICD-10-CM

## 2015-09-09 DIAGNOSIS — E785 Hyperlipidemia, unspecified: Secondary | ICD-10-CM

## 2015-09-09 DIAGNOSIS — I255 Ischemic cardiomyopathy: Secondary | ICD-10-CM

## 2015-09-09 DIAGNOSIS — I2119 ST elevation (STEMI) myocardial infarction involving other coronary artery of inferior wall: Secondary | ICD-10-CM | POA: Diagnosis not present

## 2015-09-09 DIAGNOSIS — Z9861 Coronary angioplasty status: Secondary | ICD-10-CM

## 2015-09-09 MED ORDER — CLOPIDOGREL BISULFATE 75 MG PO TABS: 75.0000 mg | ORAL_TABLET | Freq: Every day | ORAL | Status: DC

## 2015-09-09 NOTE — Progress Notes (Addendum)
Cardiology Office Note Date:  09/09/2015  Patient ID:  Caleb [name], Caleb [name] 08, 1967, MRN [id number] PCP:  No PCP Per Patient  Cardiologist:  Excell Seltzer   Chief Complaint: f/u MI  History of Present Illness: Caleb [name] is a 49 y.o. non-English speaking [place]agn[place]  male with history of recently diagnosed CAD (inferior STEMI 08/31/15 s/p ), HLD, ischemic cardiomyopathy and former tobacco abuse who presents for post-hospital follow-up. He does not speak any [place]. He has lived in the [place] since 2005 and used to smoke cigarettes for 13-14 years and drink occasionally before foregoing it all for his faith. His family recently moved a few months ago from [place]. He was admitted 11/2-11/5 as a code STEMI and underwent DES to the RCA with staged DESx2 to prox and distal LAD. He has residual 90% small OM1 which is being treated medically. DAPT for at least a year was recommended. 2D echo 09/02/15: mild LVH, EF 45-50%, mild HK of inferolateral myocardium, grade 1 DD, mild AI/MR. He was started on standard post-MI therapy with the exception of Zocor instead of Lipitor due to cost. LDL 194, trig 155, HDL 37. He was set up for flex clinic in our office and also referred to internal medicine outpatient clinics. He was seen by the congregational nurse 09/07/15 at which time he reported generalized itching with hives and erythema. Lisinopril was stopped and Benadryl was prescribed.  He is accompanied today by his son's friend along with a 3rd party interpreter. Even with the interpreters, history proves to be challenging as he provides somewhat of an inconsistent history. Initially he reported he felt completely fine since discharge without issues. We began to discuss the incident with the itching - there was initial confusion that he told the interpreter that he was told to stop the ASA and Brilinta, but later asserted he was told these were extremely important and he should NOT stop them, so he has continued taking these. After  discussion back and forth, it sounds like he did in fact stop the lisinopril as instructed. He has had full resolution of his rash and itching after discontinuing the lisinopril and using Benadryl PRN.  However, he reports that 5 minutes after taking Brilinta and aspirin this morning he developed a sensation of "liver discomfort," nausea, vomiting x1, and dyspnea that resolved within 5 minutes. He otherwise denies any other episodes of dyspnea or chest pain.   Past Medical History  Diagnosis Date  . ST elevation myocardial infarction (STEMI) of inferior wall (HCC) 08/31/2015  . Coronary artery disease     a. Inf STEMI 08/2015 s/p DES to RCA then staged DESx2 to prox and distal LAD;  residual 90% small OM1 which is being treated medically.  . Hyperlipemia   . Former tobacco use   . Ischemic cardiomyopathy     a. 2D echo 09/02/15: mild LVH, EF 45-50%, mild HK of inferolateral myocardium, grade 1 DD, mild AI/MR.    Past Surgical History  Procedure Laterality Date  . Cardiac catheterization N/A 08/31/2015    Procedure: Left Heart Cath and Coronary Angiography;  Surgeon: Tonny Bollman, MD;  Location: [facility] INVASIVE CV LAB;  Service: Cardiovascular;  Laterality: N/A;  . Cardiac catheterization N/A 08/31/2015    Procedure: Coronary Stent Intervention;  Surgeon: Tonny Bollman, MD;  Location: [facility] INVASIVE CV LAB;  Service: Cardiovascular;  Laterality: N/A;  . No past surgeries    . Cardiac catheterization N/A 09/02/2015    Procedure: Coronary Stent Intervention;  Surgeon: Donnie Coffin  Eldridge DaceVaranasi, MD;  [facility];  Service: Cardiovascular;  Laterality: N/A;  . Cardiac catheterization N/A 09/02/2015    Procedure: Left Heart Cath and Coronary Angiography;  Surgeon: Corky CraftsJayadeep S Varanasi, MD;  [facility];  Service: Cardiovascular;  Laterality: N/A;    Current Outpatient Prescriptions  Medication Sig Dispense Refill  . aspirin 81 MG chewable tablet Chew 1 tablet (81 mg total)  by mouth daily. 30 tablet 11  . diphenhydrAMINE (BENADRYL) 25 MG tablet Take 25 mg by mouth every 4 (four) hours as needed for allergies.    . metoprolol tartrate (LOPRESSOR) 25 MG tablet Take 0.5 tablets (12.5 mg total) by mouth 2 (two) times daily. 30 tablet 11  . nitroGLYCERIN (NITROSTAT) 0.4 MG SL tablet Place 1 tablet (0.4 mg total) under the tongue every 5 (five) minutes as needed for chest pain (Up to 3 doses). 25 tablet 3  . simvastatin (ZOCOR) 40 MG tablet Take 1 tablet (40 mg total) by mouth daily at 6 PM. 30 tablet 11  . ticagrelor (BRILINTA) 90 MG TABS tablet Take 1 tablet (90 mg total) by mouth 2 (two) times daily. 60 tablet 11   No current facility-administered medications for this visit.    Allergies:   Review of patient's allergies indicates no known allergies.   Social History:  The patient  reports that he has quit smoking. He does not have any smokeless tobacco history on file. He reports that he does not drink alcohol or use illicit drugs.   Family History:  The patient's family history includes Other in an other family member.  ROS:  Please see the history of present illness.  All other systems are reviewed and otherwise negative.   PHYSICAL EXAM:  VS:  BP 110/60 mmHg  Pulse 87  Ht 5\' 4"  (1.626 m)  Wt 128 lb (58.06 kg)  BMI 21.96 kg/m2 BMI: Body mass index is 21.96 kg/(m^2). Well nourished, well developed [place] M, in no acute distress HEENT: normocephalic, atraumatic, oropharynx clear, no lip swelling, no airway swelling, no stridor Neck: no JVD, carotid bruits or masses Cardiac:  normal S1, S2; RRR; no murmurs, rubs, or gallops Lungs:  clear to auscultation bilaterally, no wheezing, rhonchi or rales Abd: soft, nontender, no hepatomegaly, + BS MS: no deformity or atrophy Ext: no edema, right radial cath site without hematoma or ecchymosis; good pulse. Skin: warm and dry, no rash, urticaria or erythema Neuro:  moves all extremities spontaneously, no focal  abnormalities noted, follows commands Psych: euthymic mood, full affect  EKG:  Done today shows NSR 87bpm, TWI inferiorly  Recent Labs: 08/31/2015: B Natriuretic Peptide 9.7; TSH 0.919 09/03/2015: ALT 23; BUN 15; Creatinine, Ser 0.96; Hemoglobin 12.9*; Platelets 210; Potassium 3.6; Sodium 138  09/01/2015: Cholesterol 262*; HDL 37*; LDL Cholesterol 194*; Total CHOL/HDL Ratio 7.1; Triglycerides 155*; VLDL 31   Estimated Creatinine Clearance: 77.3 mL/min (by C-G formula based on Cr of 0.96).   Wt Readings from Last 3 Encounters:  09/09/15 128 lb (58.06 kg)  09/02/15 130 lb 1.1 oz (59 kg)     Other studies reviewed: Additional studies/records reviewed today include: summarized above  ASSESSMENT AND PLAN:  1. CAD with recent inferior STEMI as above - overall doing well post-PCI except he reports 5 minutes of dyspnea, nausea, vomiting this morning which he believes was related to the Brilinta. Hard to know if this was a true reaction or an isolated incident. There is no evidence of systemic reaction on exam. However,  with recent rash issues, I will err on the side of caution. He also does not think he would be able to afford Brilinta long-term. I d/w Dr. Eden Emms. Will d/c Brilinta and change him to Plavix  daily - first dose today. Instructed patient to seek medical care for recurrent symptoms. Otherwise continue current regimen. 2. Ischemic cardiomyopathy - will need to stay off ACEI for now given recent urticaria. Continue beta blocker. He appears euvolemic. 3. Hyperlipidemia - check LFT/lipids in 6 weeks. 4. Former tobacco abuse - remains abstinent. 5. Urticaria - resolved. Keep off lisinopril.  Disposition: F/u with Dr. Excell Seltzer or APP in 6 weeks, sooner if needed.   Current medicines are reviewed at length with the patient today.  The patient did not have any concerns regarding medicines.  Thomasene Mohair PA-C 09/09/2015 9:17 AM     [facility] [address] [place] [address] [phone] (office)  [phone] (fax)

## 2015-09-09 NOTE — Patient Instructions (Addendum)
Medication Instructions:  Your physician has recommended you make the following change in your medication: \ 1.  STOP Lisinopril 2.  STOP the Brilinta 3.  START taking Plavix 75 mg taking 1 tablet every day  CALL OUR OFFICE IF YOU CONTINUE TO HAVE THE SAME SYMPTOMS WITH THE MEDICATION  Labwork: 6 weeks AT SAME TIME AS F/U APPOINTMENT:  LIPID                                                                                       LFT  Testing/Procedures: None ordered  Follow-Up: Your physician recommends that you schedule a follow-up appointment in: 6 WEEKS WITH DR. Excell SeltzerOOPER OR EXTENDER ON A DAY DR. Excell SeltzerOOPER IS HERE (LABS SAME DAY)   Any Other Special Instructions Will Be Listed Below (If Applicable).   If you need a refill on your cardiac medications before your next appointment, please call your pharmacy.

## 2015-09-19 DIAGNOSIS — Z79899 Other long term (current) drug therapy: Secondary | ICD-10-CM

## 2015-09-19 NOTE — Congregational Nurse Program (Signed)
Home visit with interpreter Caleb [name].  Pt. States he is feeling much better since itching has subsided.  Also feeling stronger.  Looking forward to returning to work on 10/03/15.  Reviewed medication list from cardiology visit on 09/09/15. He is not taking Lopressor or Zocor but explained that he needs to start taking them which he agreed to do. BP 140/80.

## 2015-09-28 ENCOUNTER — Encounter (HOSPITAL_COMMUNITY): Payer: Self-pay | Admitting: *Deleted

## 2015-10-03 ENCOUNTER — Encounter: Payer: Self-pay | Admitting: Internal Medicine

## 2015-10-16 NOTE — Progress Notes (Signed)
Cardiology Office Note    Date:  10/17/2015   ID:  Caleb [name], DOB 1966-01-12, MRN [id number]  PCP:  No PCP Per Patient  Cardiologist:  Dr. Tonny Bollman     Chief Complaint  Patient presents with  . Follow-up  . Coronary Artery Disease    History of Present Illness:  Caleb [name] is a 49 y.o. male [place] with a hx of CAD s/p inferior STEMI 11/16, ischemic CM, HL.  He was admitted 08/31/15-09/03/15 with an inferior STEMI. RCA was occluded at cardiac catheterization and treated with a DES. He had residual disease in the proximal and distal LAD and underwent staged invention with DES 2 to the LAD. Residual disease included and 90% small OM1 lesion treated medically. Echo demonstrated EF 45-50% with inferolateral hypokinesis. He was seen in follow-up 09/09/15. He had issues with pruritus and his ACE inhibitor was stopped. He also reported issues with aspirin and Brilinta. Brilinta was stopped and he was placed on Plavix. He was kept off of ACE inhibitor. He returns for follow-up.     Past Medical History  Diagnosis Date  . ST elevation myocardial infarction (STEMI) of inferior wall (HCC) 08/31/2015  . Coronary artery disease     a. Inf STEMI 08/2015 s/p DES to RCA then staged DESx2 to prox and distal LAD;  residual 90% small OM1 which is being treated medically.  . Hyperlipemia   . Former tobacco use   . Ischemic cardiomyopathy     a. 2D echo 09/02/15: mild LVH, EF 45-50%, mild HK of inferolateral myocardium, grade 1 DD, mild AI/MR.    Past Surgical History  Procedure Laterality Date  . Cardiac catheterization N/A 08/31/2015    Procedure: Left Heart Cath and Coronary Angiography;  Surgeon: Tonny Bollman, MD;  Location: [facility] INVASIVE CV LAB;  Service: Cardiovascular;  Laterality: N/A;  . Cardiac catheterization N/A 08/31/2015    Procedure: Coronary Stent Intervention;  Surgeon: Tonny Bollman, MD;  Location: [facility] INVASIVE CV LAB;  Service: Cardiovascular;  Laterality: N/A;  . No past  surgeries    . Cardiac catheterization N/A 09/02/2015    Procedure: Coronary Stent Intervention;  Surgeon: Corky Crafts, MD;  Location: [facility] INVASIVE CV LAB;  Service: Cardiovascular;  Laterality: N/A;  . Cardiac catheterization N/A 09/02/2015    Procedure: Left Heart Cath and Coronary Angiography;  Surgeon: Corky Crafts, MD;  Location: [facility] INVASIVE CV LAB;  Service: Cardiovascular;  Laterality: N/A;    Current Outpatient Prescriptions  Medication Sig Dispense Refill  . aspirin 81 MG chewable tablet Chew 1 tablet (81 mg total) by mouth daily. 30 tablet 11  . clopidogrel (PLAVIX) 75 MG tablet Take 1 tablet (75 mg total) by mouth daily. 90 tablet 3  . diphenhydrAMINE (BENADRYL) 25 MG tablet Take 25 mg by mouth every 4 (four) hours as needed for allergies.    . metoprolol tartrate (LOPRESSOR) 25 MG tablet Take 0.5 tablets (12.5 mg total) by mouth 2 (two) times daily. 30 tablet 11  . nitroGLYCERIN (NITROSTAT) 0.4 MG SL tablet Place 1 tablet (0.4 mg total) under the tongue every 5 (five) minutes as needed for chest pain (Up to 3 doses). 25 tablet 3  . simvastatin (ZOCOR) 40 MG tablet Take 1 tablet (40 mg total) by mouth daily at 6 PM. 30 tablet 11   No current facility-administered medications for this visit.    Allergies:   Lisinopril   Social History   Social History  . Marital Status: Single  Spouse Name: N/A  . Number of Children: N/A  . Years of Education: N/A   Social History Main Topics  . Smoking status: Former Games developermoker  . Smokeless tobacco: Not on file  . Alcohol Use: No  . Drug Use: No  . Sexual Activity: Not on file   Other Topics Concern  . Not on file   Social History Narrative     Family History:  The patient's family history is not on file.   ROS:   Please see the history of present illness.    ROS All other systems reviewed and are negative.   PHYSICAL EXAM:   VS:  There were no vitals taken for this visit.   GEN: Well nourished, well developed,  in no acute distress HEENT: normal Neck: no JVD, carotid bruits, or masses Cardiac: RRR; no murmurs, rubs, or gallops,no edema  Respiratory:  clear to auscultation bilaterally, normal work of breathing GI: soft, nontender, nondistended, + BS MS: no deformity or atrophy Skin: warm and dry, no rash Neuro:  Alert and Oriented x 3, Strength and sensation are intact Psych: euthymic mood, full affect  Wt Readings from Last 3 Encounters:  09/09/15 128 lb (58.06 kg)  09/02/15 130 lb 1.1 oz (59 kg)      Studies/Labs Reviewed:   EKG:  EKG is ordered today.  The ekg ordered today demonstrates   Recent Labs: 08/31/2015: B Natriuretic Peptide 9.7; TSH 0.919 09/03/2015: ALT 23; BUN 15; Creatinine, Ser 0.96; Hemoglobin 12.9*; Platelets 210; Potassium 3.6; Sodium 138   Lipid Panel    Component Value Date/Time   CHOL 262* 09/01/2015 0312   TRIG 155* 09/01/2015 0312   HDL 37* 09/01/2015 0312   CHOLHDL 7.1 09/01/2015 0312   VLDL 31 09/01/2015 0312   LDLCALC 194* 09/01/2015 0312    Additional studies/ records that were reviewed today include:   LHC 09/02/15 LAD: prox 90%, dist 70% OM1 90%  >> small vessel >> med Rx RCA dist stent ok PCI:  3 x 38 mm Synergy DES to pLAD, 2.5 x 12 mm Synergy DES to dLAD  LHC 08/31/15 RCA distal occluded >> PCI: 4 x 18 mm Resolute DES  Echo 09/02/15 Mild LVH, EF 45-50%, inferolateral hypokinesis, grade 1 diastolic dysfunction, mild AI, mild MR    ASSESSMENT:    1. CAD S/P percutaneous coronary angioplasty   2. Cardiomyopathy, ischemic   3. Hyperlipidemia      PLAN:  In order of problems listed above:  1. CAD:  2. Ischemic cardiomyopathy: 3. Hyperlipidemia:   Medication Adjustments/Labs and Tests Ordered: Current medicines are reviewed at length with the patient today.  Concerns regarding medicines are outlined above.  Medication changes, Labs and Tests ordered today are listed below. There are no Patient Instructions on file for this visit.      Signed, Tereso NewcomerScott Aubry Rankin, PA-C  10/17/2015 8:19 AM    [facility] [address], [place], [place]  [address] Phone: [phone]; Fax: [phone]  This encounter was created in error - please disregard.

## 2015-10-17 ENCOUNTER — Encounter: Payer: PRIVATE HEALTH INSURANCE | Admitting: Physician Assistant

## 2015-10-17 ENCOUNTER — Other Ambulatory Visit: Payer: PRIVATE HEALTH INSURANCE

## 2015-10-17 DIAGNOSIS — R0989 Other specified symptoms and signs involving the circulatory and respiratory systems: Secondary | ICD-10-CM

## 2016-03-23 IMAGING — CR DG CHEST 1V PORT
1 series · 1 of 1 positions shown · non-contrast
Comparison: None.

CLINICAL DATA: ST elevation myocardial infarction, inferior wall.

EXAM:
PORTABLE CHEST 1 VIEW

[AP]
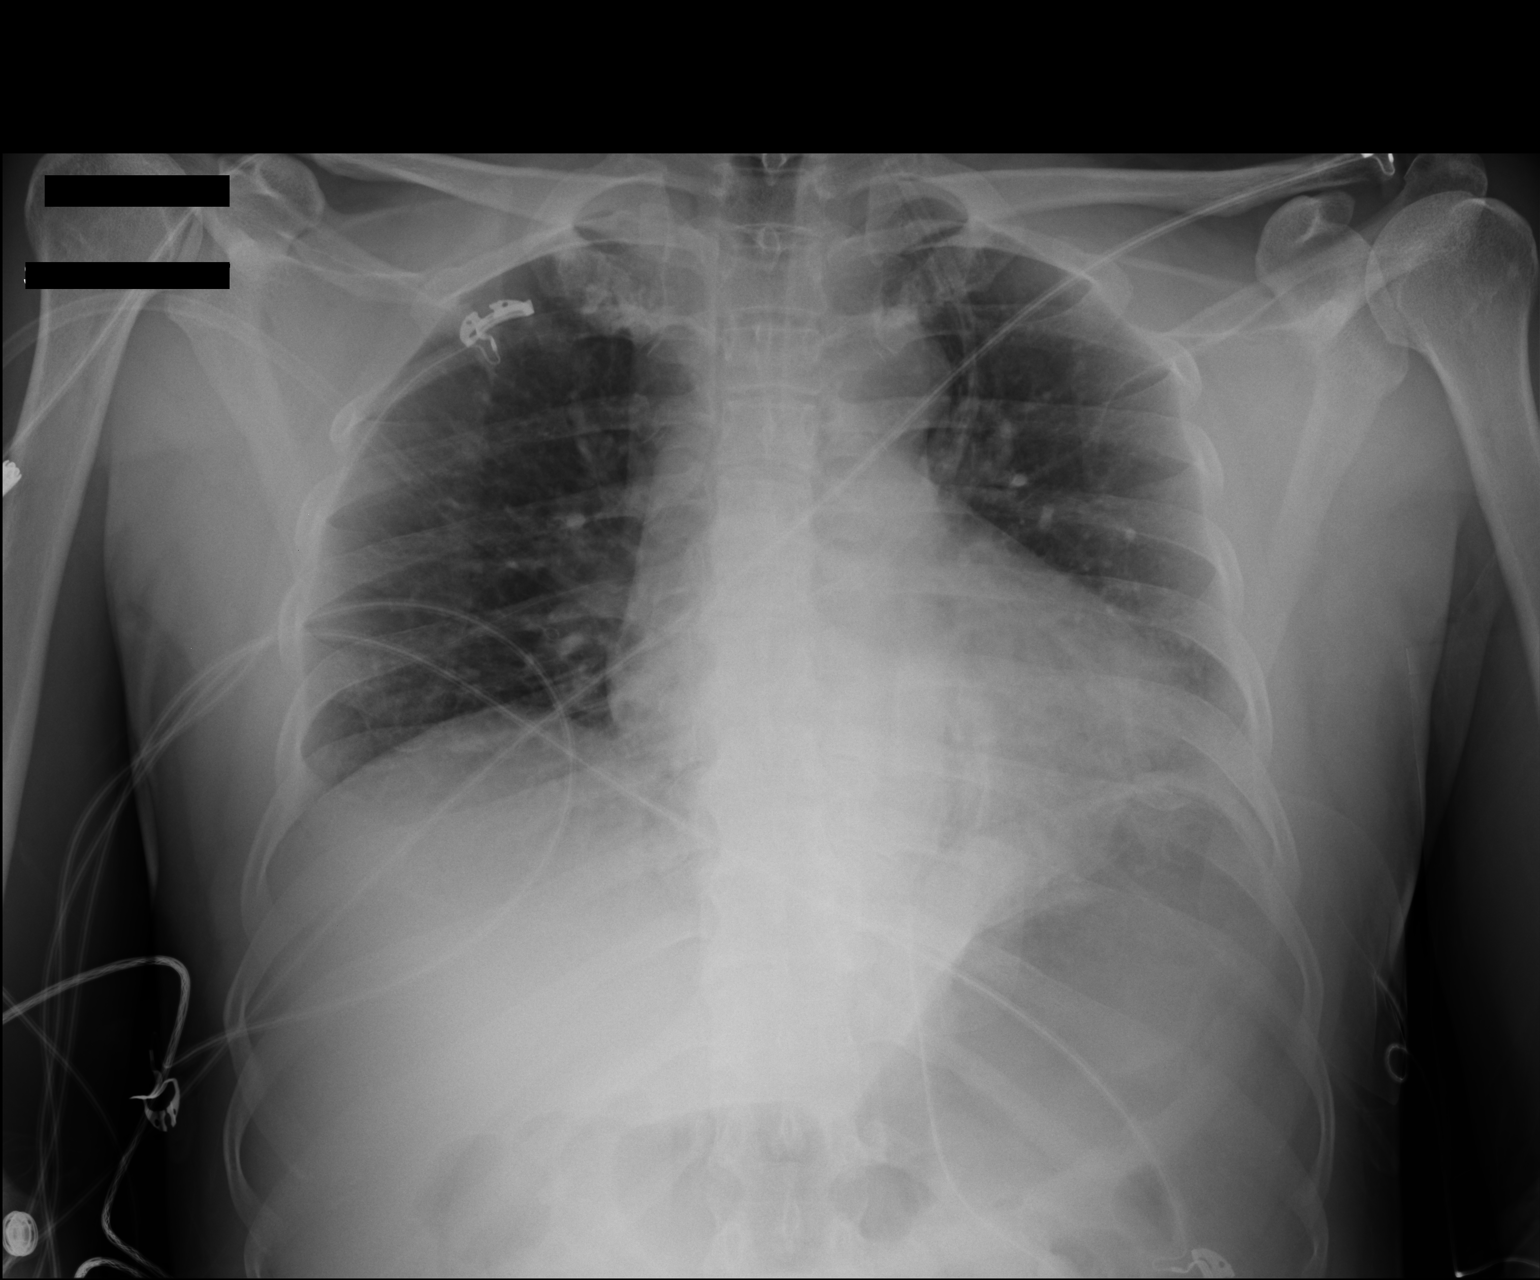

[1 of 1 positions shown; findings below may reference images not displayed]

FINDINGS: Cardiac silhouette normal in size and configuration. Normal
mediastinal and hilar contours.

Clear lungs.  No pleural effusion or pneumothorax.

Bony thorax is intact.
IMPRESSION: No active disease.

## 2016-03-25 IMAGING — CT CT HEAD W/O CM
1 series · 16 of 30 positions shown, 20 images · non-contrast
Comparison: None.

CLINICAL DATA: Heart catheterization and stent placement today.
Complaining of headache. History of recent trauma falling and
hitting his head on 08/31/2015. Patient on blood thinners.

EXAM:
CT HEAD WITHOUT CONTRAST
TECHNIQUE: Contiguous axial images were obtained from the base of the skull
through the vertex without intravenous contrast.

[Series 2: head 5.0 h30s · axial · 0.45mm/px · z∈[-160,-15]mm · 16 of 33 slices shown, 20 images]
[im 2/33  brain]
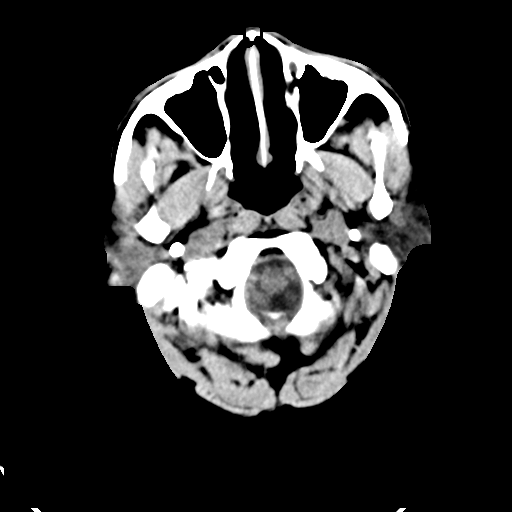
[im 2/33  bone]
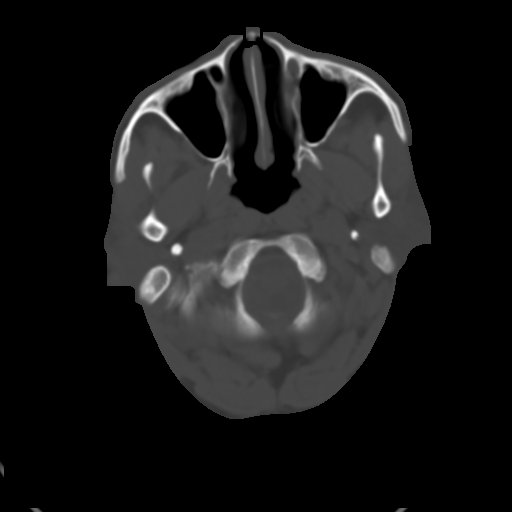
[im 4/33  brain]
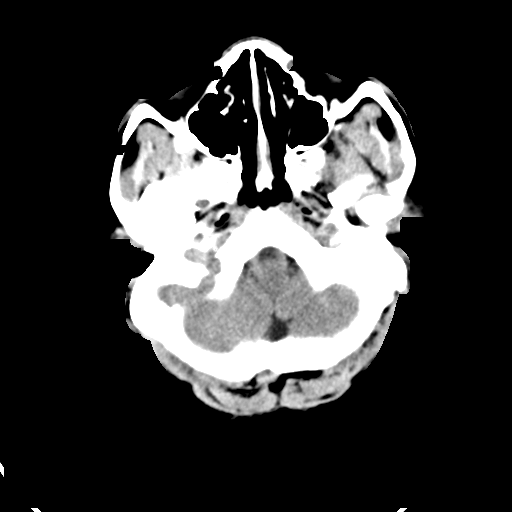
[im 6/33  brain]
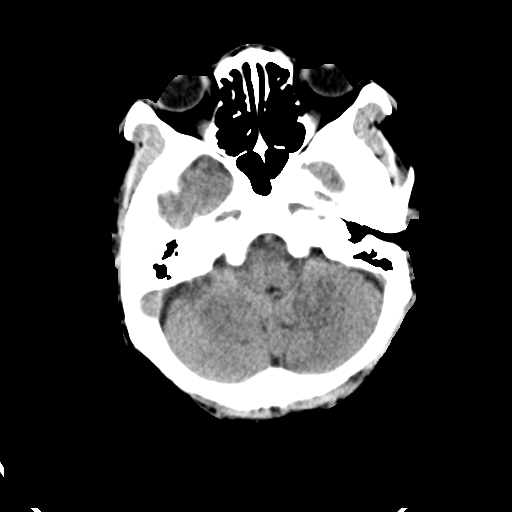
[im 8/33  brain]
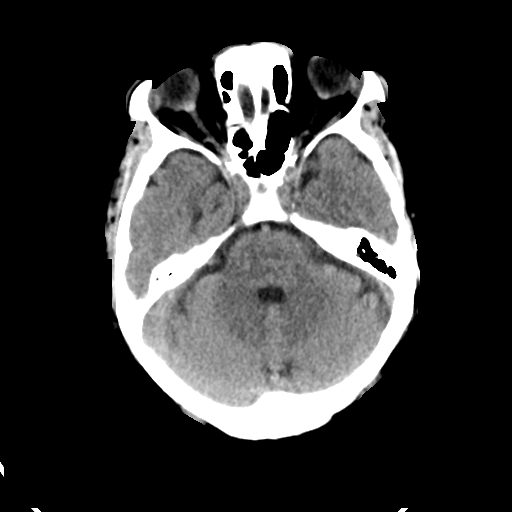
[im 9/33  brain]
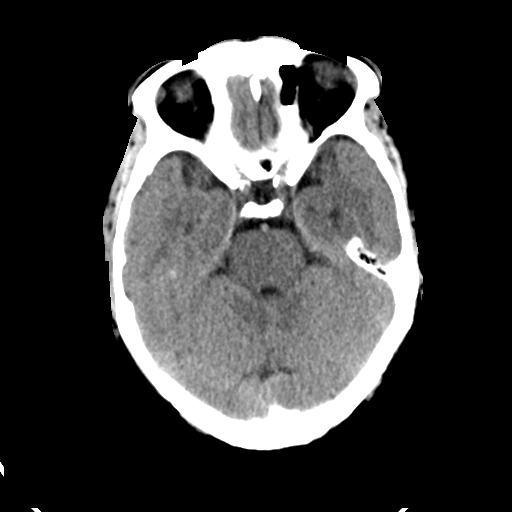
[im 9/33  bone]
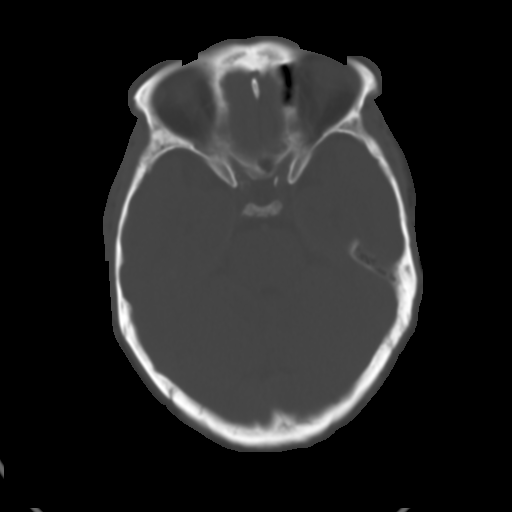
[im 12/33  brain]
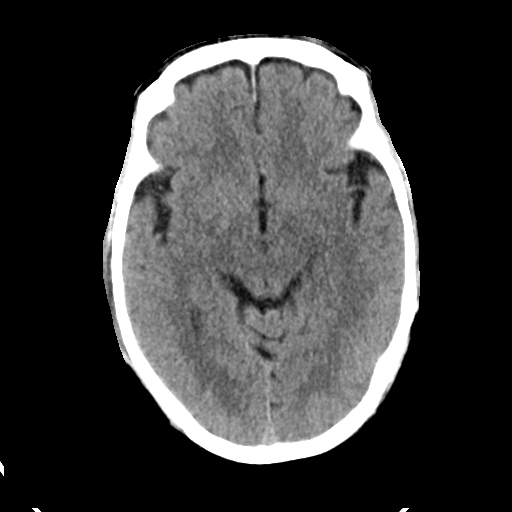
[im 14/33  brain]
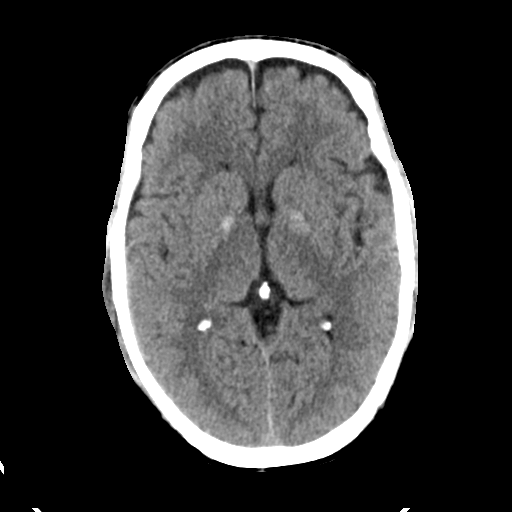
[im 16/33  brain]
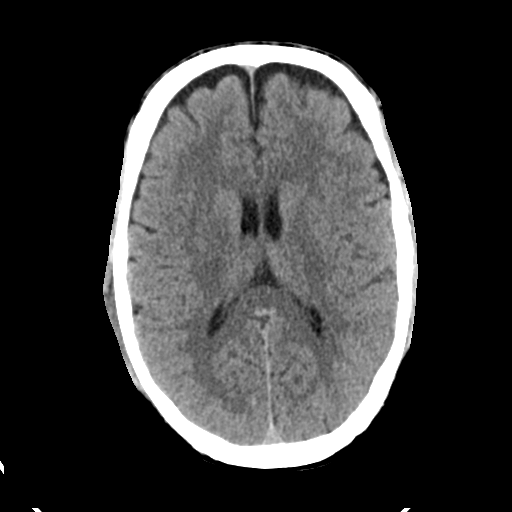
[im 17/33  brain]
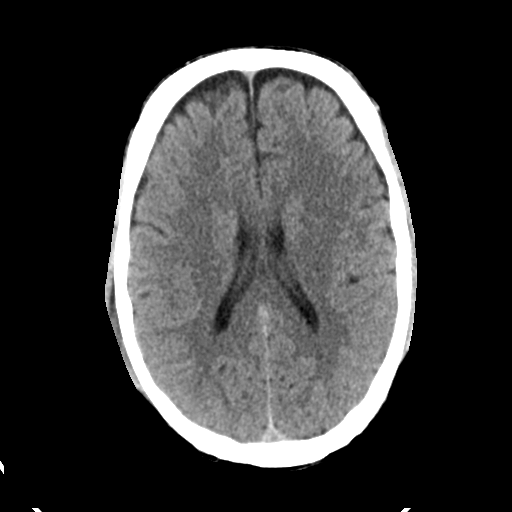
[im 17/33  bone]
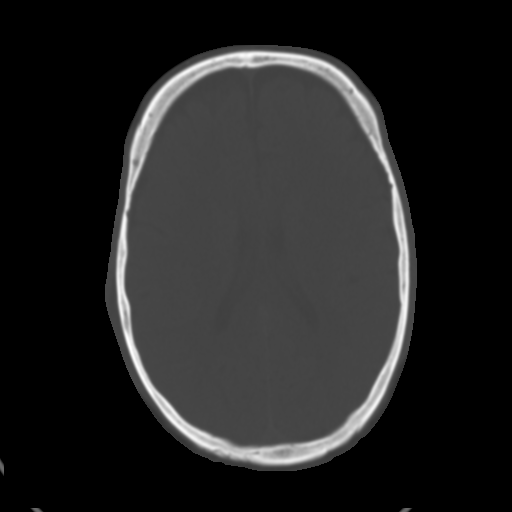
[im 19/33  brain]
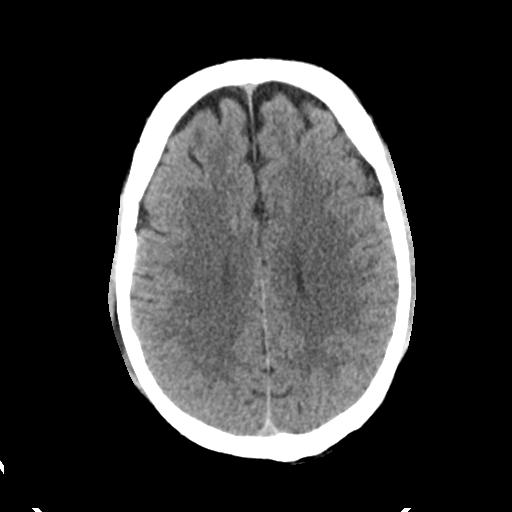
[im 21/33  brain]
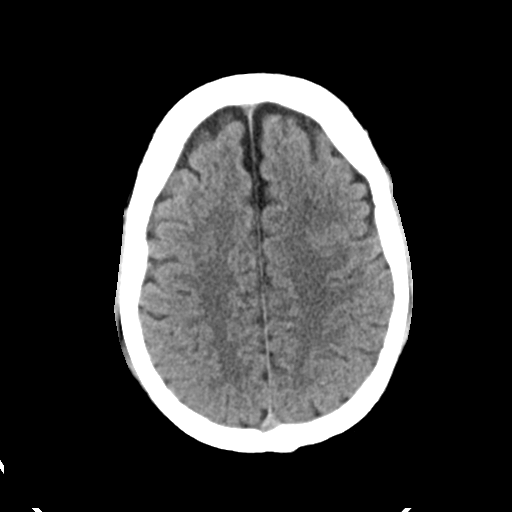
[im 24/33  brain]
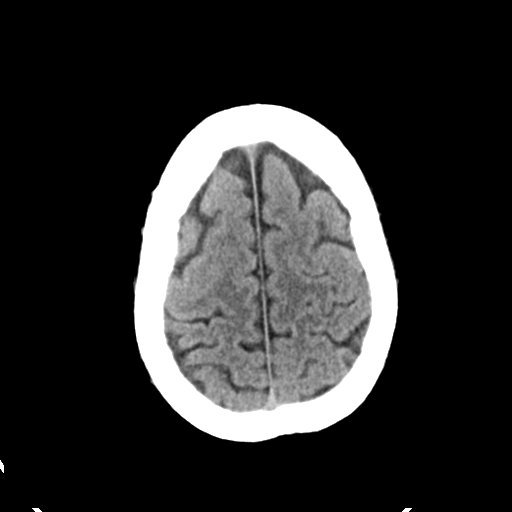
[im 25/33  brain]
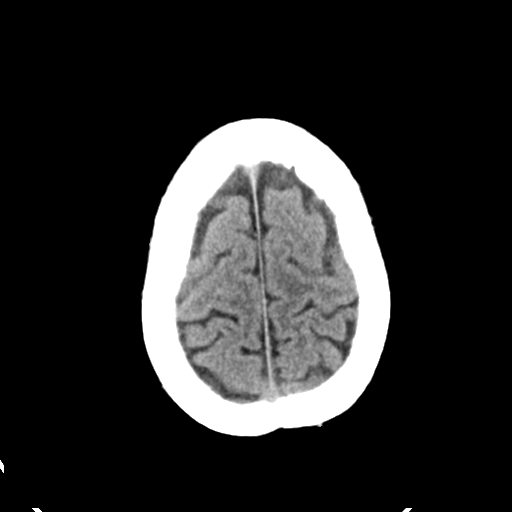
[im 25/33  bone]
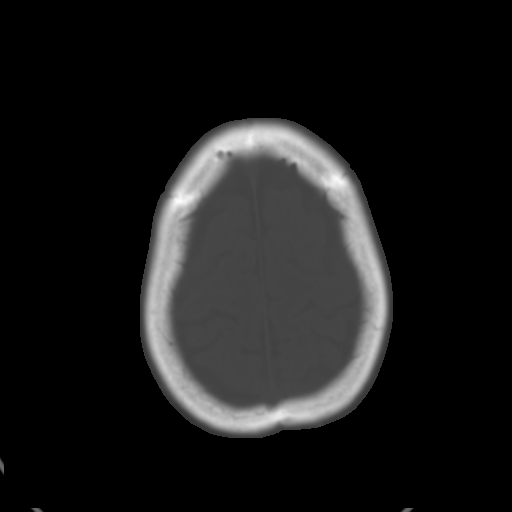
[im 27/33  brain]
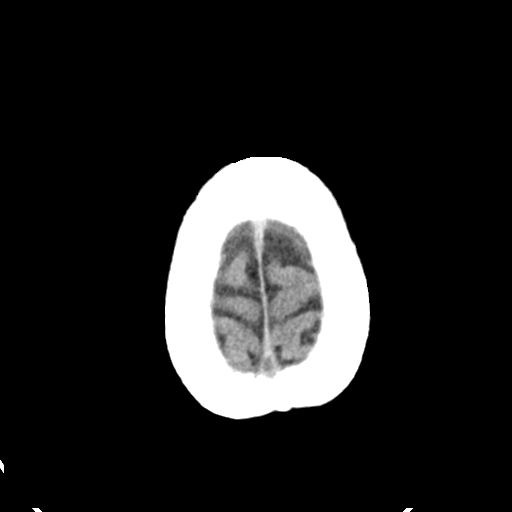
[im 29/33  brain]
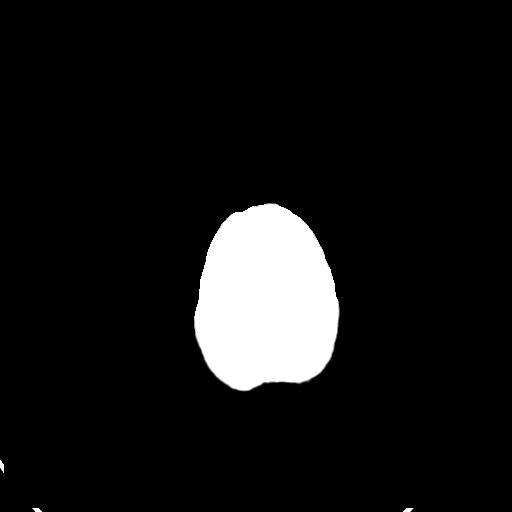
[im 31/33  brain]
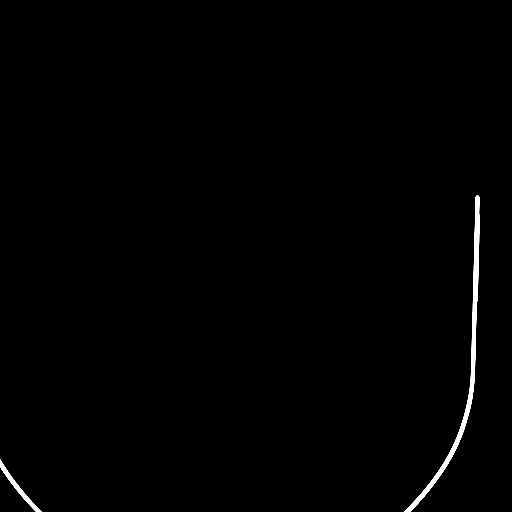

[16 of 30 positions shown; findings below may reference images not displayed]

FINDINGS: The ventricles are normal in size and configuration. There are no
parenchymal masses or mass effect. There is no evidence of an
infarct.

There are no extra-axial masses or abnormal fluid collections.

There is no intracranial hemorrhage.

Visualized sinuses and mastoid air cells are clear. No skull
fracture.
IMPRESSION: Normal unenhanced CT scan of the brain.

## 2016-05-30 DIAGNOSIS — Z719 Counseling, unspecified: Secondary | ICD-10-CM

## 2016-05-30 NOTE — Congregational Nurse Program (Signed)
Congregational Nurse Program Note  Date of Encounter: 05/30/2016  Past Medical History: Past Medical History:  Diagnosis Date  . Coronary artery disease    a. Inf STEMI 08/2015 s/p DES to RCA then staged DESx2 to prox and distal LAD;  residual 90% small OM1 which is being treated medically.  . Former tobacco use   . Hyperlipemia   . Ischemic cardiomyopathy    a. 2D echo 09/02/15: mild LVH, EF 45-50%, mild HK of inferolateral myocardium, grade 1 DD, mild AI/MR.  . ST elevation myocardial infarction (STEMI) of inferior wall (HCC) 08/31/2015    Encounter Details:     CNP Questionnaire - 05/30/16 1701      Patient Demographics   Is this a new or existing patient? Existing   Patient is considered a/an Refugee   Race Asian     Patient Assistance   Location of Patient Assistance Not Applicable   Patient's financial/insurance status Low Income;Private Insurance Coverage   Uninsured Patient No   Patient referred to apply for the following financial assistance Not Applicable   Food insecurities addressed Not Applicable   Transportation assistance No   Assistance securing medications No   Educational health offerings Navigating the healthcare system;Medications;Cardiac disease     Encounter Details   Primary purpose of visit Navigating the Healthcare System;Chronic Illness/Condition Visit  medications   Was an Emergency Department visit averted? Not Applicable   Does patient have a medical provider? Yes   Patient referred to Establish PCP   Was a mental health screening completed? (GAINS tool) No   Does patient have dental issues? No   Does patient have vision issues? No   Does your patient have an abnormal blood pressure today? No   Since previous encounter, have you referred patient for abnormal blood pressure that resulted in a new diagnosis or medication change? No   Does your patient have an abnormal blood glucose today? No   Since previous encounter, have you referred patient  for abnormal blood glucose that resulted in a new diagnosis or medication change? No   Was there a life-saving intervention made? No      Home visit with interpreter Truddie Crumble to find out why patient missed his last cardiology appointment in Dec. 2016.  Patient stated he did not have anyone to take him and he doesn't know how to find it by himself.  Stressed importance of seeing doctor.  States he does still have his medication and is taking it as ordered. CN called [facility] and scheduled an appointment for Tuesday 06/26/2016 @ 8:00 am.  CN told patient she would meet him at his house and let him follow  to the appointment.  He was given a note to let his employer know about this appointment.  CN also spoke with Hme at [facility] and scheduled a PCP appointment for Wednesday 06/20/2016 @ 1:15 pm. Brantley Fling RN, Congregational Nurse [phone]

## 2016-06-20 ENCOUNTER — Encounter: Payer: PRIVATE HEALTH INSURANCE | Admitting: Internal Medicine

## 2016-06-20 ENCOUNTER — Encounter: Payer: Self-pay | Admitting: Internal Medicine

## 2016-06-26 ENCOUNTER — Ambulatory Visit: Payer: PRIVATE HEALTH INSURANCE | Admitting: Physician Assistant

## 2018-06-06 ENCOUNTER — Other Ambulatory Visit: Payer: Self-pay

## 2018-06-06 ENCOUNTER — Encounter (HOSPITAL_COMMUNITY): Payer: Self-pay | Admitting: *Deleted

## 2018-06-06 ENCOUNTER — Inpatient Hospital Stay (HOSPITAL_COMMUNITY)
Admission: EM | Admit: 2018-06-06 | Discharge: 2018-06-08 | DRG: 247 | Disposition: A | Discharge status: Home / Self Care | Payer: Commercial Managed Care - PPO | Attending: Cardiology | Admitting: Cardiology

## 2018-06-06 ENCOUNTER — Inpatient Hospital Stay (HOSPITAL_COMMUNITY)
Admission: EM | Disposition: A | Discharge status: Home / Self Care | Payer: Self-pay | Source: Home / Self Care | Attending: Interventional Cardiology

## 2018-06-06 DIAGNOSIS — I2111 ST elevation (STEMI) myocardial infarction involving right coronary artery: Secondary | ICD-10-CM | POA: Diagnosis not present

## 2018-06-06 DIAGNOSIS — R001 Bradycardia, unspecified: Secondary | ICD-10-CM | POA: Diagnosis not present

## 2018-06-06 DIAGNOSIS — I213 ST elevation (STEMI) myocardial infarction of unspecified site: Secondary | ICD-10-CM | POA: Diagnosis present

## 2018-06-06 DIAGNOSIS — Z87891 Personal history of nicotine dependence: Secondary | ICD-10-CM | POA: Diagnosis not present

## 2018-06-06 DIAGNOSIS — Z9114 Patient's other noncompliance with medication regimen: Secondary | ICD-10-CM

## 2018-06-06 DIAGNOSIS — I9581 Postprocedural hypotension: Secondary | ICD-10-CM | POA: Diagnosis not present

## 2018-06-06 DIAGNOSIS — Z955 Presence of coronary angioplasty implant and graft: Secondary | ICD-10-CM

## 2018-06-06 DIAGNOSIS — Z888 Allergy status to other drugs, medicaments and biological substances status: Secondary | ICD-10-CM

## 2018-06-06 DIAGNOSIS — I255 Ischemic cardiomyopathy: Secondary | ICD-10-CM | POA: Diagnosis present

## 2018-06-06 DIAGNOSIS — Z7982 Long term (current) use of aspirin: Secondary | ICD-10-CM | POA: Diagnosis not present

## 2018-06-06 DIAGNOSIS — I2510 Atherosclerotic heart disease of native coronary artery without angina pectoris: Secondary | ICD-10-CM | POA: Diagnosis present

## 2018-06-06 DIAGNOSIS — I5030 Unspecified diastolic (congestive) heart failure: Secondary | ICD-10-CM | POA: Diagnosis not present

## 2018-06-06 DIAGNOSIS — I2584 Coronary atherosclerosis due to calcified coronary lesion: Secondary | ICD-10-CM | POA: Diagnosis present

## 2018-06-06 DIAGNOSIS — I252 Old myocardial infarction: Secondary | ICD-10-CM | POA: Diagnosis not present

## 2018-06-06 DIAGNOSIS — Z79899 Other long term (current) drug therapy: Secondary | ICD-10-CM

## 2018-06-06 DIAGNOSIS — E785 Hyperlipidemia, unspecified: Secondary | ICD-10-CM | POA: Diagnosis present

## 2018-06-06 DIAGNOSIS — E782 Mixed hyperlipidemia: Secondary | ICD-10-CM | POA: Diagnosis not present

## 2018-06-06 DIAGNOSIS — I2119 ST elevation (STEMI) myocardial infarction involving other coronary artery of inferior wall: Principal | ICD-10-CM | POA: Diagnosis present

## 2018-06-06 DIAGNOSIS — I97190 Other postprocedural cardiac functional disturbances following cardiac surgery: Secondary | ICD-10-CM | POA: Diagnosis not present

## 2018-06-06 HISTORY — DX: Atherosclerotic heart disease of native coronary artery without angina pectoris: I25.10

## 2018-06-06 HISTORY — PX: LEFT HEART CATH AND CORONARY ANGIOGRAPHY: CATH118249

## 2018-06-06 HISTORY — PX: CORONARY/GRAFT ACUTE MI REVASCULARIZATION: CATH118305

## 2018-06-06 LAB — TROPONIN I
Troponin I: <0.03 ng/mL (ref <0.03)
Troponin I: >65 ng/mL (ref <0.03)

## 2018-06-06 LAB — COMPREHENSIVE METABOLIC PANEL
ALT: 39 U/L (ref 0–44)
ANION GAP: 15 (ref 5–15)
AST: 52 U/L — ABNORMAL HIGH (ref 15–41)
Albumin: 3.5 g/dL (ref 3.5–5.0)
Alkaline Phosphatase: 80 U/L (ref 38–126)
BUN: 22 mg/dL — ABNORMAL HIGH (ref 6–20)
CHLORIDE: 106 mmol/L (ref 98–111)
CO2: 21 mmol/L — AB (ref 22–32)
Calcium: 8.5 mg/dL — ABNORMAL LOW (ref 8.9–10.3)
Creatinine, Ser: 1.4 mg/dL — ABNORMAL HIGH (ref 0.61–1.24)
GFR calc Af Amer: >60 mL/min (ref >60)
GFR calc non Af Amer: 57 mL/min — ABNORMAL LOW (ref >60)
Glucose, Bld: 112 mg/dL — ABNORMAL HIGH (ref 70–99)
POTASSIUM: 5.8 mmol/L — AB (ref 3.5–5.1)
Sodium: 142 mmol/L (ref 135–145)
Total Bilirubin: 1.6 mg/dL — ABNORMAL HIGH (ref 0.3–1.2)
Total Protein: 6.6 g/dL (ref 6.5–8.1)

## 2018-06-06 LAB — POCT ACTIVATED CLOTTING TIME: ACTIVATED CLOTTING TIME: 455 s

## 2018-06-06 LAB — I-STAT TROPONIN, ED: Troponin i, poc: 0.02 ng/mL (ref 0.00–0.08)

## 2018-06-06 LAB — CBC WITH DIFFERENTIAL/PLATELET
Abs Immature Granulocytes: 0.1 10*3/uL (ref 0.0–0.1)
BASOS ABS: 0.1 10*3/uL (ref 0.0–0.1)
BASOS PCT: 1 %
Eosinophils Absolute: 0.2 10*3/uL (ref 0.0–0.7)
Eosinophils Relative: 1 %
HCT: 46.9 % (ref 39.0–52.0)
Hemoglobin: 14.2 g/dL (ref 13.0–17.0)
IMMATURE GRANULOCYTES: 1 %
LYMPHS PCT: 33 %
Lymphs Abs: 4.5 10*3/uL — ABNORMAL HIGH (ref 0.7–4.0)
MCH: 25.6 pg — ABNORMAL LOW (ref 26.0–34.0)
MCHC: 30.3 g/dL (ref 30.0–36.0)
MCV: 84.7 fL (ref 78.0–100.0)
MONOS PCT: 7 %
Monocytes Absolute: 0.9 10*3/uL (ref 0.1–1.0)
NEUTROS ABS: 7.9 10*3/uL — AB (ref 1.7–7.7)
NEUTROS PCT: 57 %
PLATELETS: 227 10*3/uL (ref 150–400)
RBC: 5.54 MIL/uL (ref 4.22–5.81)
RDW: 13.3 % (ref 11.5–15.5)
WBC: 13.6 10*3/uL — ABNORMAL HIGH (ref 4.0–10.5)

## 2018-06-06 LAB — POCT I-STAT, CHEM 8
BUN: 23 mg/dL — ABNORMAL HIGH (ref 6–20)
CALCIUM ION: 1.15 mmol/L (ref 1.15–1.40)
CREATININE: 1 mg/dL (ref 0.61–1.24)
Chloride: 106 mmol/L (ref 98–111)
GLUCOSE: 123 mg/dL — AB (ref 70–99)
HEMATOCRIT: 44 % (ref 39.0–52.0)
Hemoglobin: 15 g/dL (ref 13.0–17.0)
Potassium: 3.5 mmol/L (ref 3.5–5.1)
Sodium: 141 mmol/L (ref 135–145)
TCO2: 21 mmol/L — AB (ref 22–32)

## 2018-06-06 LAB — BASIC METABOLIC PANEL
ANION GAP: 13 (ref 5–15)
BUN: 18 mg/dL (ref 6–20)
CO2: 19 mmol/L — AB (ref 22–32)
CREATININE: 1.06 mg/dL (ref 0.61–1.24)
Calcium: 8.5 mg/dL — ABNORMAL LOW (ref 8.9–10.3)
Chloride: 110 mmol/L (ref 98–111)
GFR calc Af Amer: >60 mL/min (ref >60)
GFR calc non Af Amer: >60 mL/min (ref >60)
GLUCOSE: 115 mg/dL — AB (ref 70–99)
Potassium: 4 mmol/L (ref 3.5–5.1)
Sodium: 142 mmol/L (ref 135–145)

## 2018-06-06 LAB — POCT I-STAT 3, ART BLOOD GAS (G3+)
ACID-BASE DEFICIT: 5 mmol/L — AB (ref 0.0–2.0)
Bicarbonate: 20 mmol/L (ref 20.0–28.0)
O2 SAT: 98 %
PO2 ART: 116 mmHg — AB (ref 83.0–108.0)
TCO2: 21 mmol/L — ABNORMAL LOW (ref 22–32)
pCO2 arterial: 35.6 mmHg (ref 32.0–48.0)
pH, Arterial: 7.357 (ref 7.350–7.450)

## 2018-06-06 LAB — MRSA PCR SCREENING: MRSA BY PCR: NEGATIVE

## 2018-06-06 LAB — LIPID PANEL
CHOL/HDL RATIO: 8 ratio
CHOLESTEROL: 264 mg/dL — AB (ref 0–200)
HDL: 33 mg/dL — ABNORMAL LOW (ref >40)
LDL Cholesterol: 153 mg/dL — ABNORMAL HIGH (ref 0–99)
TRIGLYCERIDES: 391 mg/dL — AB (ref <150)
VLDL: 78 mg/dL — AB (ref 0–40)

## 2018-06-06 LAB — PROTIME-INR
INR: 0.95
PROTHROMBIN TIME: 12.6 s (ref 11.4–15.2)

## 2018-06-06 LAB — APTT: APTT: 28 s (ref 24–36)

## 2018-06-06 SURGERY — CORONARY/GRAFT ACUTE MI REVASCULARIZATION
Anesthesia: LOCAL

## 2018-06-06 MED ORDER — HEPARIN SODIUM (PORCINE) 1000 UNIT/ML IJ SOLN
INTRAMUSCULAR | Status: AC
  Filled 2018-06-06: qty 1

## 2018-06-06 MED ORDER — HEPARIN SODIUM (PORCINE) 5000 UNIT/ML IJ SOLN
4000.0000 [IU] | Freq: Once | INTRAMUSCULAR | Status: AC
  Administered 2018-06-06: 4000 [IU] via INTRAVENOUS
  Filled 2018-06-06: qty 1

## 2018-06-06 MED ORDER — LABETALOL HCL 5 MG/ML IV SOLN: 10.0000 mg | INTRAVENOUS | Status: AC | PRN

## 2018-06-06 MED ORDER — TICAGRELOR 90 MG PO TABS
90.0000 mg | ORAL_TABLET | Freq: Two times a day (BID) | ORAL | Status: DC
  Administered 2018-06-06 – 2018-06-08 (×4): 90 mg via ORAL
  Filled 2018-06-06 (×4): qty 1

## 2018-06-06 MED ORDER — MIDAZOLAM HCL 2 MG/2ML IJ SOLN
INTRAMUSCULAR | Status: AC
  Filled 2018-06-06: qty 2

## 2018-06-06 MED ORDER — SODIUM CHLORIDE 0.9 % IV SOLN
250.0000 mL | INTRAVENOUS | Status: DC | PRN
Start: 2018-06-06 — End: 2018-06-08

## 2018-06-06 MED ORDER — TIROFIBAN HCL IV 12.5 MG/250 ML
INTRAVENOUS | Status: AC | PRN
  Administered 2018-06-06: 0.15 ug/kg/min via INTRAVENOUS

## 2018-06-06 MED ORDER — TIROFIBAN (AGGRASTAT) BOLUS VIA INFUSION
INTRAVENOUS | Status: DC | PRN
  Administered 2018-06-06: 1770 ug via INTRAVENOUS

## 2018-06-06 MED ORDER — SODIUM CHLORIDE 0.9 % IV SOLN: INTRAVENOUS | Status: AC

## 2018-06-06 MED ORDER — TIROFIBAN HCL IV 12.5 MG/250 ML: 0.1500 ug/kg/min | INTRAVENOUS | Status: AC

## 2018-06-06 MED ORDER — VERAPAMIL HCL 2.5 MG/ML IV SOLN
INTRAVENOUS | Status: DC | PRN
  Administered 2018-06-06: 10 mL via INTRA_ARTERIAL

## 2018-06-06 MED ORDER — ACETAMINOPHEN 325 MG PO TABS: 650.0000 mg | ORAL_TABLET | ORAL | Status: DC | PRN

## 2018-06-06 MED ORDER — TICAGRELOR 90 MG PO TABS
ORAL_TABLET | ORAL | Status: AC
  Filled 2018-06-06: qty 2

## 2018-06-06 MED ORDER — ASPIRIN 81 MG PO CHEW
324.0000 mg | CHEWABLE_TABLET | Freq: Once | ORAL | Status: DC
  Filled 2018-06-06: qty 4

## 2018-06-06 MED ORDER — SODIUM CHLORIDE 0.9% FLUSH
3.0000 mL | Freq: Two times a day (BID) | INTRAVENOUS | Status: DC
  Administered 2018-06-06 – 2018-06-07 (×4): 3 mL via INTRAVENOUS

## 2018-06-06 MED ORDER — HEPARIN SODIUM (PORCINE) 1000 UNIT/ML IJ SOLN
INTRAMUSCULAR | Status: DC | PRN
  Administered 2018-06-06: 7000 [IU] via INTRAVENOUS

## 2018-06-06 MED ORDER — SODIUM CHLORIDE 0.9 % IV SOLN
INTRAVENOUS | Status: DC | PRN
  Administered 2018-06-06: 10 mL/h via INTRAVENOUS

## 2018-06-06 MED ORDER — HEPARIN (PORCINE) IN NACL 1000-0.9 UT/500ML-% IV SOLN
INTRAVENOUS | Status: AC
  Filled 2018-06-06: qty 1000

## 2018-06-06 MED ORDER — IOHEXOL 350 MG/ML SOLN
INTRAVENOUS | Status: DC | PRN
  Administered 2018-06-06: 100 mL via INTRA_ARTERIAL

## 2018-06-06 MED ORDER — LIDOCAINE HCL (PF) 1 % IJ SOLN
INTRAMUSCULAR | Status: DC | PRN
  Administered 2018-06-06: 2 mL

## 2018-06-06 MED ORDER — SODIUM CHLORIDE 0.9% FLUSH: 3.0000 mL | INTRAVENOUS | Status: DC | PRN

## 2018-06-06 MED ORDER — ONDANSETRON HCL 4 MG/2ML IJ SOLN
4.0000 mg | Freq: Four times a day (QID) | INTRAMUSCULAR | Status: DC | PRN
Start: 2018-06-06 — End: 2018-06-08

## 2018-06-06 MED ORDER — HEPARIN (PORCINE) IN NACL 1000-0.9 UT/500ML-% IV SOLN
INTRAVENOUS | Status: DC | PRN
  Administered 2018-06-06 (×2): 500 mL

## 2018-06-06 MED ORDER — TIROFIBAN HCL IV 12.5 MG/250 ML
INTRAVENOUS | Status: AC
  Filled 2018-06-06: qty 250

## 2018-06-06 MED ORDER — ATORVASTATIN CALCIUM 80 MG PO TABS
80.0000 mg | ORAL_TABLET | Freq: Every day | ORAL | Status: DC
  Administered 2018-06-06 – 2018-06-07 (×2): 80 mg via ORAL
  Filled 2018-06-06 (×2): qty 1

## 2018-06-06 MED ORDER — VERAPAMIL HCL 2.5 MG/ML IV SOLN
INTRAVENOUS | Status: AC
  Filled 2018-06-06: qty 2

## 2018-06-06 MED ORDER — TICAGRELOR 90 MG PO TABS
ORAL_TABLET | ORAL | Status: DC | PRN
  Administered 2018-06-06: 180 mg via ORAL

## 2018-06-06 MED ORDER — NOREPINEPHRINE BITARTRATE 1 MG/ML IV SOLN
INTRAVENOUS | Status: AC | PRN
  Administered 2018-06-06: 10 ug/min via INTRAVENOUS

## 2018-06-06 MED ORDER — SODIUM CHLORIDE 0.9 % IV SOLN
INTRAVENOUS | Status: DC
  Administered 2018-06-06: 1000 mL via INTRAVENOUS

## 2018-06-06 MED ORDER — FENTANYL CITRATE (PF) 100 MCG/2ML IJ SOLN
INTRAMUSCULAR | Status: AC
  Filled 2018-06-06: qty 2

## 2018-06-06 MED ORDER — MIDAZOLAM HCL 2 MG/2ML IJ SOLN
INTRAMUSCULAR | Status: DC | PRN
  Administered 2018-06-06: 1 mg via INTRAVENOUS

## 2018-06-06 MED ORDER — NITROGLYCERIN 1 MG/10 ML FOR IR/CATH LAB
INTRA_ARTERIAL | Status: AC
  Filled 2018-06-06: qty 10

## 2018-06-06 MED ORDER — NOREPINEPHRINE 4 MG/250ML-% IV SOLN
INTRAVENOUS | Status: AC
  Filled 2018-06-06: qty 250

## 2018-06-06 MED ORDER — ASPIRIN 81 MG PO CHEW
81.0000 mg | CHEWABLE_TABLET | Freq: Every day | ORAL | Status: DC
  Administered 2018-06-07 – 2018-06-08 (×2): 81 mg via ORAL
  Filled 2018-06-06 (×2): qty 1

## 2018-06-06 MED ORDER — LIDOCAINE HCL (PF) 1 % IJ SOLN
INTRAMUSCULAR | Status: AC
  Filled 2018-06-06: qty 30

## 2018-06-06 MED ORDER — HYDRALAZINE HCL 20 MG/ML IJ SOLN: 5.0000 mg | INTRAMUSCULAR | Status: AC | PRN

## 2018-06-06 MED ORDER — FENTANYL CITRATE (PF) 100 MCG/2ML IJ SOLN
INTRAMUSCULAR | Status: DC | PRN
  Administered 2018-06-06: 25 ug via INTRAVENOUS

## 2018-06-06 SURGICAL SUPPLY — 21 items
BALLN SAPPHIRE 2.5X15 (BALLOONS) ×2
BALLN SAPPHIRE ~~LOC~~ 4.0X18 (BALLOONS) ×2 IMPLANT
BALLOON SAPPHIRE 2.5X15 (BALLOONS) ×1 IMPLANT
CATH EXTRAC PRONTO 5.5F 138CM (CATHETERS) ×2 IMPLANT
CATH INFINITI 5 FR JL3.5 (CATHETERS) ×2 IMPLANT
CATH VISTA GUIDE 6FR JR4 (CATHETERS) ×2 IMPLANT
DEVICE RAD COMP TR BAND LRG (VASCULAR PRODUCTS) ×2 IMPLANT
GLIDESHEATH SLEND SS 6F .021 (SHEATH) ×2 IMPLANT
GUIDEWIRE INQWIRE 1.5J.035X260 (WIRE) ×1 IMPLANT
INQWIRE 1.5J .035X260CM (WIRE) ×2
KIT ENCORE 26 ADVANTAGE (KITS) ×2 IMPLANT
KIT HEART LEFT (KITS) ×2 IMPLANT
KIT HEMO VALVE WATCHDOG (MISCELLANEOUS) ×2 IMPLANT
PACK CARDIAC CATHETERIZATION (CUSTOM PROCEDURE TRAY) ×2 IMPLANT
SHEATH PROBE COVER 6X72 (BAG) ×2 IMPLANT
STENT SYNERGY DES 4X38 (Permanent Stent) ×2 IMPLANT
TRANSDUCER W/STOPCOCK (MISCELLANEOUS) ×2 IMPLANT
TUBING CIL FLEX 10 FLL-RA (TUBING) ×2 IMPLANT
WIRE ASAHI MIRACLEBROS-3 180CM (WIRE) ×2 IMPLANT
WIRE ASAHI PROWATER 180CM (WIRE) ×2 IMPLANT
WIRE FIGHTER CROSSING 190CM (WIRE) ×2 IMPLANT

## 2018-06-06 NOTE — ED Notes (Signed)
Family at bedside. 

## 2018-06-06 NOTE — ED Provider Notes (Signed)
[facility] EMERGENCY DEPARTMENT Provider Note   CSN: [id number] Arrival date & time: 06/06/18  0522     History   Chief Complaint Chief Complaint  Patient presents with  . Code STEMI    HPI Caleb [name] is a 52 y.o. male.  The history is provided by the patient. A language interpreter was used.  He has history of coronary artery disease, hyperlipidemia, ischemic cardiomyopathy and comes in because of an episode of chest pain.  He is somewhat poor and unreliable historian, but apparently he was awakened by chest pain at about 4 AM.  He is not able to describe the pain.  There is no dyspnea, nausea, diaphoresis.  He was brought in by ambulance who gave him aspirin, but he did not take any medication at home.  Of note, he is not taking any of his cardiac medications because he has been having chest pain.  He cannot tell me how long following his STEMI he did take his medications.  Past Medical History:  Diagnosis Date  . Coronary artery disease    a. Inf STEMI 08/2015 s/p DES to RCA then staged DESx2 to prox and distal LAD;  residual 90% small OM1 which is being treated medically.  . Former tobacco use   . Hyperlipemia   . Ischemic cardiomyopathy    a. 2D echo 09/02/15: mild LVH, EF 45-50%, mild HK of inferolateral myocardium, grade 1 DD, mild AI/MR.  . ST elevation myocardial infarction (STEMI) of inferior wall (HCC) 08/31/2015    Patient Active Problem List   Diagnosis Date Noted  . CAD S/P percutaneous coronary angioplasty 09/03/2015  . Hyperlipidemia 09/03/2015  . Former tobacco use 09/03/2015  . Cardiomyopathy, ischemic 09/03/2015  . ST elevation myocardial infarction (STEMI) of inferior wall (HCC) 08/31/2015    Past Surgical History:  Procedure Laterality Date  . CARDIAC CATHETERIZATION N/A 08/31/2015   Procedure: Left Heart Cath and Coronary Angiography;  Surgeon: Tonny Bollman, MD;  Location: [facility] INVASIVE CV LAB;  Service: Cardiovascular;  Laterality:  N/A;  . CARDIAC CATHETERIZATION N/A 08/31/2015   Procedure: Coronary Stent Intervention;  Surgeon: Tonny Bollman, MD;  Location: [facility] INVASIVE CV LAB;  Service: Cardiovascular;  Laterality: N/A;  . CARDIAC CATHETERIZATION N/A 09/02/2015   Procedure: Coronary Stent Intervention;  Surgeon: Corky Crafts, MD;  Location: [facility] INVASIVE CV LAB;  Service: Cardiovascular;  Laterality: N/A;  . CARDIAC CATHETERIZATION N/A 09/02/2015   Procedure: Left Heart Cath and Coronary Angiography;  Surgeon: Corky Crafts, MD;  Location: [facility] INVASIVE CV LAB;  Service: Cardiovascular;  Laterality: N/A;  . NO PAST SURGERIES          Home Medications    Prior to Admission medications   Medication Sig Start Date End Date Taking? Authorizing Provider  aspirin 81 MG chewable tablet Chew 1 tablet (81 mg total) by mouth daily. 09/03/15   Beather Arbour, MD  clopidogrel (PLAVIX) 75 MG tablet Take 1 tablet (75 mg total) by mouth daily. 09/09/15   Dunn, Tacey Ruiz, PA-C  diphenhydrAMINE (BENADRYL) 25 MG tablet Take 25 mg by mouth every 4 (four) hours as needed for allergies.    [provider]  metoprolol tartrate (LOPRESSOR) 25 MG tablet Take 0.5 tablets (12.5 mg total) by mouth 2 (two) times daily. 09/03/15   Beather Arbour, MD  nitroGLYCERIN (NITROSTAT) 0.4 MG SL tablet Place 1 tablet (0.4 mg total) under the tongue every 5 (five) minutes as needed for chest pain (Up to 3  doses). 09/03/15   Beather ArbourPatel, Rushil V, MD  simvastatin (ZOCOR) 40 MG tablet Take 1 tablet (40 mg total) by mouth daily at 6 PM. 09/03/15   Beather ArbourPatel, Rushil V, MD    Family History Family History  Problem Relation Age of Onset  . Other Unknown        Patient does not know if any hx of CAD    Social History Social History   Tobacco Use  . Smoking status: Former Games developermoker  . Smokeless tobacco: Never Used  Substance Use Topics  . Alcohol use: No    Alcohol/week: 0.0 standard drinks  . Drug use: No     Allergies   Lisinopril   Review  of Systems Review of Systems  All other systems reviewed and are negative.    Physical Exam Updated Vital Signs BP (!) 129/93   Pulse 80   Temp 98.7 F (37.1 C) (Temporal)   Resp 18   Ht 5\' 3"  (1.6 m)   Wt 70.8 kg   SpO2 97%   BMI 27.63 kg/m   Physical Exam  Nursing note and vitals reviewed.  52 year old male, resting comfortably and in no acute distress. Vital signs are significant for mildly elevated diastolic blood pressure. Oxygen saturation is 97%, which is normal. Head is normocephalic and atraumatic. PERRLA, EOMI. Oropharynx is clear. Neck is nontender and supple without adenopathy or JVD. Back is nontender and there is no CVA tenderness. Lungs are clear without rales, wheezes, or rhonchi. Chest is nontender. Heart has regular rate and rhythm without murmur. Abdomen is soft, flat, nontender without masses or hepatosplenomegaly and peristalsis is normoactive. Extremities have no cyanosis or edema, full range of motion is present. Skin is warm and dry without rash. Neurologic: Mental status is normal, cranial nerves are intact, there are no motor or sensory deficits.  ED Treatments / Results  Labs (all labs ordered are listed, but only abnormal results are displayed) Labs Reviewed  CBC WITH DIFFERENTIAL/PLATELET  PROTIME-INR  APTT  COMPREHENSIVE METABOLIC PANEL  TROPONIN I  LIPID PANEL  I-STAT TROPONIN, ED    EKG EKG Interpretation  Date/Time:  Friday June 06 2018 05:28:02 EDT Ventricular Rate:  80 PR Interval:    QRS Duration: 106 QT Interval:  416 QTC Calculation: 480 R Axis:   84 Text Interpretation:  Sinus rhythm Prolonged PR interval Inferoposterior infarct, acute (RCA) Probable RV involvement, suggest recording right precordial leads When compared with ECG of 09/03/2015, ** ** ACUTE MI / STEMI ** ** is now present Confirmed by Dione BoozeGlick, Clarissia Mckeen ([id number]) on 06/06/2018 5:32:31 AM   Radiology No results found.  Procedures Procedures  CRITICAL  CARE Performed by: Dione Boozeavid Laresha Bacorn Total critical care time: 40 minutes Critical care time was exclusive of separately billable procedures and treating other patients. Critical care was necessary to treat or prevent imminent or life-threatening deterioration. Critical care was time spent personally by me on the following activities: development of treatment plan with patient and/or surrogate as well as nursing, discussions with consultants, evaluation of patient's response to treatment, examination of patient, obtaining history from patient or surrogate, ordering and performing treatments and interventions, ordering and review of laboratory studies, ordering and review of radiographic studies, pulse oximetry and re-evaluation of patient's condition.  Medications Ordered in ED Medications  0.9 %  sodium chloride infusion (1,000 mLs Intravenous New Bag/Given 06/06/18 0541)  aspirin chewable tablet 324 mg (324 mg Oral Not Given 06/06/18 0541)  heparin injection 4,000 Units (4,000 Units Intravenous  Given 06/06/18 0542)     Initial Impression / Assessment and Plan / ED Course  I have reviewed the triage vital signs and the nursing notes.  Pertinent labs & imaging results that were available during my care of the patient were reviewed by me and considered in my medical decision making (see chart for details).  STEMI which is involving the inferior and posterior and probably anterolateral walls.  He is resting comfortably and is hemodynamically stable.  He is given heparin.  Old records are reviewed confirming STEMI in 2016 with stents placed in the right coronary artery and LAD.  He was supposed to be on dual antiplatelet therapy for 1year.  With his history of noncompliance with antiplatelet agents, I am concerned that he has clotted off one or both of his stents.  He will be sent emergently to the cardiac catheterization lab.  Patient seen jointly with Dr. Allena Katz of cardiology service.  Final  Clinical Impressions(s) / ED Diagnoses   Final diagnoses:  ST elevation myocardial infarction (STEMI), unspecified artery [facility])    ED Discharge Orders    None       Dione Booze, MD 06/06/18 [phone]

## 2018-06-06 NOTE — ED Notes (Signed)
Patient transported to cath lab

## 2018-06-06 NOTE — ED Notes (Signed)
Bilateral groins prepped.

## 2018-06-06 NOTE — Progress Notes (Signed)
   06/06/18 0600  Clinical Encounter Type  Visited With Family;Health care provider  Visit Type ED (code stemi)  Referral From Nurse  Consult/Referral To Chaplain  Stress Factors  Family Stress Factors  (worried)   Responded to a code stemi.  Patient had arrived and was being prepared to go up to the cath lab.  Escorted the family to the 2H waiting area and explained they should wait there.  Their English is spotty, but a relative can translate for the others.  Will follow and support as needed. Chaplain Agustin CreeNewton Darly Fails

## 2018-06-06 NOTE — ED Triage Notes (Signed)
Patient is non english speaking, through interrupter line patient c/o chest pain onset approx. 4am,  Denies n/v per family had a syncopal episode at home, upon arrival to ed alert oriented per interrupter line. Cards@ bedside and EDP upon patient arrival

## 2018-06-06 NOTE — Progress Notes (Signed)
EKG CRITICAL VALUE     12 lead EKG performed.  Critical value noted.  Lenis NoonPhillip Timcheck, RN notified.   Alexy Bringle H, CCT 06/06/2018 7:41 AM

## 2018-06-06 NOTE — H&P (Addendum)
Cardiology History & Physical    Patient ID: Caleb [name] MRN: [id number], DOB: 1966/06/07 Date of Encounter: 06/06/2018, 5:43 AM Primary Physician: Patient, No Pcp Per  Chief Complaint: Chest pain   HPI: Caleb [name] is a 52 y.o. male ([place] speaking) with history of CAD (prior RCA STEMI s/p PCI, also s/p LAD PCI), HLD, who presents with chest pain.  Of note, he has not been taking any of his prescribed medications after his MI.   Patient woke up this morning with acute CP that began at approximately 4AM, with some SOB.  Ultimately, due to ongoing chest discomfort, EMS was called, and initial ECG showed inferior STE, with reciprocal depressions in the lateral leads.    In the ED, he has been hemodynamically stable, with BP in the 100s/60s.  He continued to have ongoing chest discomfort.  He received full dose ASA and 4000 units of IV heparin.  A code STEMI was activated.  Past Medical History:  Diagnosis Date  . Coronary artery disease    a. Inf STEMI 08/2015 s/p DES to RCA then staged DESx2 to prox and distal LAD;  residual 90% small OM1 which is being treated medically.  . Former tobacco use   . Hyperlipemia   . Ischemic cardiomyopathy    a. 2D echo 09/02/15: mild LVH, EF 45-50%, mild HK of inferolateral myocardium, grade 1 DD, mild AI/MR.  . ST elevation myocardial infarction (STEMI) of inferior wall (HCC) 08/31/2015     Surgical History:  Past Surgical History:  Procedure Laterality Date  . CARDIAC CATHETERIZATION N/A 08/31/2015   Procedure: Left Heart Cath and Coronary Angiography;  Surgeon: Tonny Bollman, MD;  Location: [facility] INVASIVE CV LAB;  Service: Cardiovascular;  Laterality: N/A;  . CARDIAC CATHETERIZATION N/A 08/31/2015   Procedure: Coronary Stent Intervention;  Surgeon: Tonny Bollman, MD;  Location: [facility] INVASIVE CV LAB;  Service: Cardiovascular;  Laterality: N/A;  . CARDIAC CATHETERIZATION N/A 09/02/2015   Procedure: Coronary Stent Intervention;  Surgeon: Corky Crafts,  MD;  Location: [facility] INVASIVE CV LAB;  Service: Cardiovascular;  Laterality: N/A;  . CARDIAC CATHETERIZATION N/A 09/02/2015   Procedure: Left Heart Cath and Coronary Angiography;  Surgeon: Corky Crafts, MD;  Location: [facility] INVASIVE CV LAB;  Service: Cardiovascular;  Laterality: N/A;  . NO PAST SURGERIES       Home Meds: Prior to Admission medications   Medication Sig Start Date End Date Taking? Authorizing Provider  aspirin 81 MG chewable tablet Chew 1 tablet (81 mg total) by mouth daily. 09/03/15   Beather Arbour, MD  clopidogrel (PLAVIX) 75 MG tablet Take 1 tablet (75 mg total) by mouth daily. 09/09/15   Dunn, Tacey Ruiz, PA-C  diphenhydrAMINE (BENADRYL) 25 MG tablet Take 25 mg by mouth every 4 (four) hours as needed for allergies.    [provider]  metoprolol tartrate (LOPRESSOR) 25 MG tablet Take 0.5 tablets (12.5 mg total) by mouth 2 (two) times daily. 09/03/15   Beather Arbour, MD  nitroGLYCERIN (NITROSTAT) 0.4 MG SL tablet Place 1 tablet (0.4 mg total) under the tongue every 5 (five) minutes as needed for chest pain (Up to 3 doses). 09/03/15   Beather Arbour, MD  simvastatin (ZOCOR) 40 MG tablet Take 1 tablet (40 mg total) by mouth daily at 6 PM. 09/03/15   Beather Arbour, MD    Allergies:  Allergies  Allergen Reactions  . Lisinopril     Rash     Social History  Socioeconomic History  . Marital status: Single    Spouse name: Not on file  . Number of children: Not on file  . Years of education: Not on file  . Highest education level: Not on file  Occupational History  . Not on file  Social Needs  . Financial resource strain: Not on file  . Food insecurity:    Worry: Not on file    Inability: Not on file  . Transportation needs:    Medical: Not on file    Non-medical: Not on file  Tobacco Use  . Smoking status: Former Games developermoker  . Smokeless tobacco: Never Used  Substance and Sexual Activity  . Alcohol use: No    Alcohol/week: 0.0 standard drinks  . Drug  use: No  . Sexual activity: Not on file  Lifestyle  . Physical activity:    Days per week: Not on file    Minutes per session: Not on file  . Stress: Not on file  Relationships  . Social connections:    Talks on phone: Not on file    Gets together: Not on file    Attends religious service: Not on file    Active member of club or organization: Not on file    Attends meetings of clubs or organizations: Not on file    Relationship status: Not on file  . Intimate partner violence:    Fear of current or ex partner: Not on file    Emotionally abused: Not on file    Physically abused: Not on file    Forced sexual activity: Not on file  Other Topics Concern  . Not on file  Social History Narrative  . Not on file     Family History  Problem Relation Age of Onset  . Other Unknown        Patient does not know if any hx of CAD    Review of Systems: All other systems reviewed and are otherwise negative except as noted above.  Labs:   Lab Results  Component Value Date   WBC 7.7 09/03/2015   HGB 12.9 (L) 09/03/2015   HCT 40.9 09/03/2015   MCV 81.8 09/03/2015   PLT 210 09/03/2015   No results for input(s): NA, K, CL, CO2, BUN, CREATININE, CALCIUM, PROT, BILITOT, ALKPHOS, ALT, AST, GLUCOSE in the last 168 hours.  Invalid input(s): LABALBU No results for input(s): CKTOTAL, CKMB, TROPONINI in the last 72 hours. Lab Results  Component Value Date   CHOL 262 (H) 09/01/2015   HDL 37 (L) 09/01/2015   LDLCALC 194 (H) 09/01/2015   TRIG 155 (H) 09/01/2015   No results found for: DDIMER  Radiology/Studies:  No results found. Wt Readings from Last 3 Encounters:  06/06/18 70.8 kg  09/09/15 58.1 kg  09/02/15 59 kg    EKG: SR, 1st degree AVB, inferior STE.  Physical Exam: Blood pressure 118/66, pulse 78, temperature 98.7 F (37.1 C), temperature source Temporal, resp. rate 18, height 5\' 3"  (1.6 m), weight 70.8 kg, SpO2 97 %. Body mass index is 27.63 kg/m. General: Well  developed, well nourished, in no acute distress. Head: Normocephalic, atraumatic, sclera non-icteric, no xanthomas, nares are without discharge.  Neck: Negative for carotid bruits. JVD not elevated. Lungs: Clear bilaterally to auscultation without wheezes, rales, or rhonchi. Breathing is unlabored. Heart: RRR with S1 S2. No murmurs, rubs, or gallops appreciated. Abdomen: Soft, non-tender, non-distended with normoactive bowel sounds. No hepatomegaly. No rebound/guarding. No obvious abdominal masses. Msk:  Strength and  tone appear normal for age. Extremities: No clubbing or cyanosis. No edema.  Distal pedal pulses are 2+ and equal bilaterally. Neuro: Alert and oriented X 3. No focal deficit. No facial asymmetry. Moves all extremities spontaneously. Psych:  Responds to questions appropriately with a normal affect.    Assessment and Plan   83M with known coronary disease, who presents with STEMI and medication noncompliance concerning for possible RCA stent thrombosis.  We will take him emergently to the cath lab for coronary angiography and possible PCI.  Signed, Caleb Plants, MD 06/06/2018, 5:43 AM   I have examined the patient and reviewed assessment and plan and discussed with patient.  Agree with above as stated.  Patient with acute chest pain and inferior ST elevation.  I made the decision to bring the patient for emergent cath.    Cath showed very late stent thrombosis of the distal RCA stent.  Distal RCA was stented again after thrombectomy.  Would continue clopidogrel indefinitely after 1 year of Brilinta given this history.    Restart statin.   Holding beta blocker due to hypotension and bradycardia during the procedure.  Lance Muss

## 2018-06-07 ENCOUNTER — Other Ambulatory Visit: Payer: Self-pay

## 2018-06-07 ENCOUNTER — Encounter (HOSPITAL_COMMUNITY): Payer: Self-pay | Admitting: *Deleted

## 2018-06-07 LAB — BASIC METABOLIC PANEL
Anion gap: 8 (ref 5–15)
BUN: 17 mg/dL (ref 6–20)
CHLORIDE: 107 mmol/L (ref 98–111)
CO2: 24 mmol/L (ref 22–32)
CREATININE: 1.05 mg/dL (ref 0.61–1.24)
Calcium: 8.5 mg/dL — ABNORMAL LOW (ref 8.9–10.3)
Glucose, Bld: 109 mg/dL — ABNORMAL HIGH (ref 70–99)
POTASSIUM: 3.6 mmol/L (ref 3.5–5.1)
SODIUM: 139 mmol/L (ref 135–145)

## 2018-06-07 LAB — CBC
HEMATOCRIT: 42.2 % (ref 39.0–52.0)
Hemoglobin: 13.1 g/dL (ref 13.0–17.0)
MCH: 25.7 pg — ABNORMAL LOW (ref 26.0–34.0)
MCHC: 31 g/dL (ref 30.0–36.0)
MCV: 82.7 fL (ref 78.0–100.0)
PLATELETS: 207 10*3/uL (ref 150–400)
RBC: 5.1 MIL/uL (ref 4.22–5.81)
RDW: 13.5 % (ref 11.5–15.5)
WBC: 10 10*3/uL (ref 4.0–10.5)

## 2018-06-07 LAB — TROPONIN I

## 2018-06-07 LAB — HEMOGLOBIN A1C
Hgb A1c MFr Bld: 5.8 % — ABNORMAL HIGH (ref 4.8–5.6)
Mean Plasma Glucose: 119.76 mg/dL

## 2018-06-07 LAB — LIPID PANEL
Cholesterol: 251 mg/dL — ABNORMAL HIGH (ref 0–200)
HDL: 28 mg/dL — AB (ref >40)
LDL Cholesterol: 157 mg/dL — ABNORMAL HIGH (ref 0–99)
Total CHOL/HDL Ratio: 9 RATIO
Triglycerides: 331 mg/dL — ABNORMAL HIGH (ref <150)
VLDL: 66 mg/dL — ABNORMAL HIGH (ref 0–40)

## 2018-06-07 MED ORDER — CARVEDILOL 3.125 MG PO TABS
3.1250 mg | ORAL_TABLET | Freq: Two times a day (BID) | ORAL | Status: DC
  Administered 2018-06-07 – 2018-06-08 (×2): 3.125 mg via ORAL
  Filled 2018-06-07 (×2): qty 1

## 2018-06-07 NOTE — Progress Notes (Signed)
CARDIAC REHAB PHASE I   Coronary angiogram with stent care after, ticagrelor oral tablet, and exercise guidelines during cardiac rehabilitation were printed in vietnamese via clinical references. Explained to pt through family importance of Brilinta and ASA. Restrictions reviewed along with exercise guidelines. Family states pt denies CP or SOB with ambulation. MI book not given as a copy in vietnamese is not available. Will refer to CRP II [place].   [id number] Reynold Boweneresa  Frazer [place], RN BSN 06/07/2018 1:04 PM

## 2018-06-07 NOTE — Progress Notes (Signed)
Cardiology Rounding Note  PCP-Cardiologist: No primary care provider on file.   Subjective:    Caleb [name] is a 52 y.o. male ([place] with history of CAD (prior RCA STEMI s/p PCI, also s/p LAD PCI), HLD, who was admitted 8/9 with inferior STEMI. Cath showed very late stent thrombosis of the distal RCA stent.  Distal RCA was stented again after thrombectomy.    Denies CP or SOB. Ambualting room without difficulty   Objective:   Weight Range: 70.8 kg Body mass index is 27.63 kg/m.   Vital Signs:   Temp:  [97.9 F (36.6 C)-99 F (37.2 C)] 97.9 F (36.6 C) (08/10 1123) Pulse Rate:  [60-86] 60 (08/10 0700) Resp:  [0-20] 11 (08/10 0700) BP: (81-109)/(59-84) 101/66 (08/10 1123) SpO2:  [97 %-100 %] 97 % (08/10 0700) Last BM Date: 06/05/18  Weight change: Filed Weights   06/06/18 0537  Weight: 70.8 kg    Intake/Output:   Intake/Output Summary (Last 24 hours) at 06/07/2018 1154 Last data filed at 06/07/2018 1000 Gross per 24 hour  Intake 240 ml  Output 1450 ml  Net -1210 ml      Physical Exam    General:  Well appearing. No resp difficulty HEENT: Normal Neck: Supple. JVP . Carotids 2+ bilat; no bruits. No lymphadenopathy or thyromegaly appreciated. Cor: PMI nondisplaced. Regular rate & rhythm. No rubs, gallops or murmurs. Lungs: Clear Abdomen: Soft, nontender, nondistended. No hepatosplenomegaly. No bruits or masses. Good bowel sounds. Extremities: No cyanosis, clubbing, rash, edema Neuro: Alert & orientedx3, cranial nerves grossly intact. moves all 4 extremities w/o difficulty. Affect pleasant   Telemetry   NSR 70-80s  Personally reviewed    Labs    CBC Recent Labs    06/06/18 0534 06/06/18 0613 06/07/18 0111  WBC 13.6*  --  10.0  NEUTROABS 7.9*  --   --   HGB 14.2 15.0 13.1  HCT 46.9 44.0 42.2  MCV 84.7  --  82.7  PLT 227  --  207   Basic Metabolic Panel Recent Labs    60/45/4007/07/18 1020 06/07/18 0111  NA 142 139  K 4.0 3.6  CL 110  107  CO2 19* 24  GLUCOSE 115* 109*  BUN 18 17  CREATININE 1.06 1.05  CALCIUM 8.5* 8.5*   Liver Function Tests Recent Labs    06/06/18 0534  AST 52*  ALT 39  ALKPHOS 80  BILITOT 1.6*  PROT 6.6  ALBUMIN 3.5   No results for input(s): LIPASE, AMYLASE in the last 72 hours. Cardiac Enzymes Recent Labs    06/06/18 1020 06/06/18 1857 06/07/18 0111  TROPONINI >65.00* >65.00* >65.00*    BNP: BNP (last 3 results) No results for input(s): BNP in the last 8760 hours.  ProBNP (last 3 results) No results for input(s): PROBNP in the last 8760 hours.   D-Dimer No results for input(s): DDIMER in the last 72 hours. Hemoglobin A1C Recent Labs    06/07/18 0111  HGBA1C 5.8*   Fasting Lipid Panel Recent Labs    06/07/18 0111  CHOL 251*  HDL 28*  LDLCALC 157*  TRIG 331*  CHOLHDL 9.0   Thyroid Function Tests No results for input(s): TSH, T4TOTAL, T3FREE, THYROIDAB in the last 72 hours.  Invalid input(s): FREET3  Other results:   Imaging     No results found.   Medications:     Scheduled Medications: . aspirin  324 mg Oral Once  . aspirin  81 mg Oral Daily  .  atorvastatin  80 mg Oral q1800  . sodium chloride flush  3 mL Intravenous Q12H  . ticagrelor  90 mg Oral BID     Infusions: . sodium chloride 100 mL/hr at 06/06/18 0545  . sodium chloride       PRN Medications:  sodium chloride, acetaminophen, ondansetron (ZOFRAN) IV, sodium chloride flush    Patient Profile   Caleb [name] is a 52 y.o. male ([place] speaking) with history of CAD (prior RCA STEMI s/p PCI, also s/p LAD PCI), HLD, who was admitted 8/9 with inferior STEMI. Cath showed very late stent thrombosis of the distal RCA stent.  Distal RCA was stented again after thrombectomy.     Assessment/Plan   1. CAD with inferior STEMI  - admitted 8/9 with inferior STEMI. Cath showed very late stent thrombosis of the distal RCA stent.  Distal RCA was stented again after thrombectomy.   - peak  troponin > 65. Given size of infarct will get echo - Continue ASA, Statin. Start b-blocker . Will need lifelong Brilinta with stent thrombosis - Can go to tele - Consult CR - Possible home in am   2. HL - LDL 157 - Continue atorva 80. May need PCSK   Length of Stay: 1  Arvilla Meres, MD  06/07/2018, 11:54 AM  Advanced Heart Failure Team Pager [phone] (M-F; 7a - 4p)  Please contact [facility] for night-coverage after hours (4p -7a ) and weekends on [email or web address]

## 2018-06-08 ENCOUNTER — Encounter (HOSPITAL_COMMUNITY): Payer: Self-pay | Admitting: Physician Assistant

## 2018-06-08 ENCOUNTER — Inpatient Hospital Stay (HOSPITAL_COMMUNITY): Payer: Commercial Managed Care - PPO

## 2018-06-08 DIAGNOSIS — I5030 Unspecified diastolic (congestive) heart failure: Secondary | ICD-10-CM

## 2018-06-08 LAB — ECHOCARDIOGRAM COMPLETE
Height: 63 in
Weight: 2273.6 oz

## 2018-06-08 MED ORDER — CARVEDILOL 3.125 MG PO TABS: 3.1250 mg | ORAL_TABLET | Freq: Two times a day (BID) | ORAL | 11 refills | Status: AC

## 2018-06-08 MED ORDER — ASPIRIN EC 81 MG PO TBEC: 81.0000 mg | DELAYED_RELEASE_TABLET | Freq: Every day | ORAL | Status: AC

## 2018-06-08 MED ORDER — ATORVASTATIN CALCIUM 80 MG PO TABS: 80.0000 mg | ORAL_TABLET | Freq: Every day | ORAL | 11 refills | Status: AC

## 2018-06-08 MED ORDER — NITROGLYCERIN 0.4 MG SL SUBL: 0.4000 mg | SUBLINGUAL_TABLET | SUBLINGUAL | 11 refills | Status: AC | PRN

## 2018-06-08 MED ORDER — TICAGRELOR 90 MG PO TABS: 90.0000 mg | ORAL_TABLET | Freq: Two times a day (BID) | ORAL | 3 refills | Status: AC

## 2018-06-08 NOTE — Discharge Instructions (Signed)
Ch?m [place] trang web xuyn tm  Ti c th? mong ??i g sau th? t?c? Sau th? t?c c?a b?n, thng th??ng c nh?ng ?i?u sau ?y:  B?m tm t?i v? tr xuyn tm th??ng m? d?n trong vng 1 Tu?n 2.  Thu th?p mu trong m (kh?i mu t?) c th? gy ?au khi ch?m vo. N th??ng s? gi?m kch th??c v ?? m?m trong vng 1 tu?n2.  Th?c hi?n theo cc h??ng d?n t?i nh:  Ch? dng thu?c theo ch? d?n c?a nh cung c?p d?ch v? ch?m [place] s?c kh?e c?a b?n.  B?n c th? t?m 24 gi? 48 gi? sau khi lm th? thu?t ho?c theo ch? d?n c?a nh cung c?p d?ch v? ch?m [place] s?c kh?e c?a b?n. Tho b?ng (b?ng) v nh? nhng r?a ch? b?ng x bng v n??c. V? nh? vng kh b?ng kh?n s?ch. Khng ch xt trang web, v ?i?u ny c th? gy ch?y mu.  Khng t?m, b?i ho?c s? d?ng b?n n??c nng cho ??n khi nh cung c?p d?ch v? ch?m [place] s?c kh?e c?a b?n ch?p thu?n.  Ki?m tra v? tr chn c?a b?n m?i ngy ?? tm v?t ??, s?ng ho?c thot n??c.  Khng thoa b?t ho?c kem d??ng da vo trang web.  Khng u?n cong ho?c u?n cong cnh tay b? ?nh h??ng trong 24 gi? ho?c theo ch? d?n c?a nh cung c?p d?ch v? ch?m [place] s?c kh?e c?a b?n.  Khng ??y ho?c ko cc v?t n?ng b?ng cnh tay b? ?nh h??ng trong 24 gi? ho?c theo ch? d?n c?a nh cung c?p d?ch v? ch?m [place] s?c kh?e c?a b?n.  Khng nng qu 10 lb (4,5 kg) trong 5 ngy sau khi lm th? thu?t ho?c theo ch? d?n c?a nh cung c?p d?ch v? ch?m [place] s?c kh?e c?a b?n.  Lin h? v?i nh cung c?p d?ch v? ch?m [place] s?c kh?e n?u:  B?n b? s?t.  B?n b? ?n l?nh.  B?n ? t?ng ch?y mu t? cc trang web radial. Gi? p l?c trn trang web. Nh?n tr? gip ngay n?u:  B?n b? ?au b?t th??ng ? v? tr xuyn tm.  B?n b? ??, ?m ho?c s?ng t?i v? tr xuyn tm.  B?n c h? th?ng thot n??c (tr? m?t l??ng mu nh? trn b?ng) t? v? tr xuyn tm.  V? tr xuyn tm ?ang ch?y mu, v ch?y mu khng ng?ng sau 30 pht gi? p l?c ?n ??nh trn trang web.  Cnh tay ho?c bn tay c?a b?n tr? nn nh?t nh?t, mt m?, tru ch?c ho?c t  li?t. Thng tin ny khng nh?m thay th? l?i khuyn dnh cho nh cung c?p d?ch v? ch?m [place] s?c kh?e c?a b?n. Hy ch?c ch?n r?ng b?n th?o lu?n v? b?t k? cu h?i no b?n c v?i nh cung c?p d?ch v? ch?m  s?c kh?e c?a b?n.

## 2018-06-08 NOTE — Progress Notes (Signed)
Nurse paged about adding f/u info to AVS. I have sent a message to our office's scheduling team requesting a follow-up appointment, and our office will call the patient with this information. Pt also needs work note. Printed one advising not to return to work until cleared by cardiologist. Ronie Spiesayna [place] Pettengill PA-C

## 2018-06-08 NOTE — Care Management (Signed)
Met with patient and family interpreter in the room after attempting to use language line without success.  Pt given Brilinta 30 day free card.  Called ahead to [facility] on [address] to ensure Kary Kos is in stock.  Pt has green card and was also given a [place] patient assistance application to apply for discounted Brilinta from the drug company. Advised to complete ASAP.    Pt also given [place] letter to cover his other prescriptions.  Pt agrees he can pay $3/each for other prescriptions.  Pt given information about clinics and advised to call for appointment tomorrow morning.

## 2018-06-08 NOTE — Discharge Summary (Addendum)
Discharge Summary    Patient ID: Caleb [name],  MRN: [id number], DOB/AGE: 51-05-1966 51 y.o.  Admit date: 06/06/2018 Discharge date: 06/08/2018  Primary Care Provider: Patient, No Pcp Per Primary Cardiologist: Tonny Bollman, MD  Discharge Diagnoses    Principal Problem:   Acute ST elevation myocardial infarction (STEMI) of inferior wall [facility]) Active Problems:   CAD (coronary artery disease)   Cardiomyopathy, ischemic   Hyperlipidemia   Allergies Allergies  Allergen Reactions  . Lisinopril Rash         Diagnostic Studies/Procedures    Echocardiogram 06/08/18 Mild LVH, EF 45-50, basal inferolateral and inferior akinesis, grade 1 diastolic dysfunction, trivial AI, trivial MR  Cardiac catheterization 06/06/2018 LM normal LAD proximal stent patent, distal stent patent LCx okay; OM1 90 RCA distal 100-thrombotic PCI: Distal RCA thrombectomy and 4 x 38 mm Synergy DES to the distal RCA           _____________   History of Present Illness     Caleb [name] is a 52 y.o. [place] male with a history of coronary artery disease status post inferior ST elevation myocardial infarction in 2016 treated with a drug-eluting stent to the RCA and staged intervention with drug-eluting stent x2 to the proximal and distal LAD, ischemic cardiomyopathy, hyperlipidemia.  He presented to the hospital after awakening with sudden onset chest pain.  ECG demonstrated inferior ST elevation and reciprocal changes in the lateral leads.  He was taken emergently to the cardiac catheterization lab for inferior ST elevation myocardial infarction.  Hospital Course     Consultants:  None   The patient was taken emergently to the cardiac catheterization lab.  Cardiac catheterization demonstrated distal RCA stent thrombosis.  The patient was treated with thrombectomy and placement of a Synergy DES.  Uninterrupted dual anti-platelet therapy with aspirin and Ticagrelor was recommended for minimum of 12 months.   After 1 year, it is felt that consideration should be given to transition ticagrelor to clopidogrel given his presentation with late stent thrombosis.  Post PCI, he was bradycardic and hypotensive.  He was not placed on beta-blocker and Levophed was used for hemodynamic support.  His blood pressure and heart rate improved and he was started on low-dose beta-blocker the next day.  He was evaluated by Dr. Jens Som this morning and was noted to be in stable condition without further chest pain.  Echocardiogram this morning demonstrates EF 45-50% with inferolateral and inferior akinesis and mild diastolic dysfunction.  He is discharged home in stable condition.  _____________  Discharge Vitals Blood pressure 104/69, pulse 67, temperature 98.3 F (36.8 C), temperature source Oral, resp. rate 15, height 5\' 3"  (1.6 m), weight 64.5 kg, SpO2 96 %.  Filed Weights   06/06/18 0537 06/07/18 1706  Weight: 70.8 kg 64.5 kg    Labs & Radiologic Studies    CBC Recent Labs    06/06/18 0534 06/06/18 0613 06/07/18 0111  WBC 13.6*  --  10.0  NEUTROABS 7.9*  --   --   HGB 14.2 15.0 13.1  HCT 46.9 44.0 42.2  MCV 84.7  --  82.7  PLT 227  --  207   Basic Metabolic Panel Recent Labs    40/98/11 1020 06/07/18 0111  NA 142 139  K 4.0 3.6  CL 110 107  CO2 19* 24  GLUCOSE 115* 109*  BUN 18 17  CREATININE 1.06 1.05  CALCIUM 8.5* 8.5*   Liver Function Tests Recent Labs    06/06/18 0534  AST 52*  ALT 39  ALKPHOS 80  BILITOT 1.6*  PROT 6.6  ALBUMIN 3.5   No results for input(s): LIPASE, AMYLASE in the last 72 hours. Cardiac Enzymes Recent Labs    06/06/18 1020 06/06/18 1857 06/07/18 0111  TROPONINI >65.00* >65.00* >65.00*   Hemoglobin A1C Recent Labs    06/07/18 0111  HGBA1C 5.8*   Fasting Lipid Panel Recent Labs    06/07/18 0111  CHOL 251*  HDL 28*  LDLCALC 157*  TRIG 331*  CHOLHDL 9.0   _____________  No results found. Disposition   Pt is being discharged home today in  good condition.  Follow-up Plans & Appointments     Discharge Instructions    AMB Referral to Cardiac Rehabilitation - Phase II   Complete by:  As directed    Diagnosis:   STEMI Coronary Stents     Amb Referral to Cardiac Rehabilitation   Complete by:  As directed    Diagnosis:   Coronary Stents STEMI     Diet - low sodium heart healthy   Complete by:  As directed    Discharge wound care:   Complete by:  As directed    Call [phone] for any swelling bleeding bruising or fever   Driving Restrictions   Complete by:  As directed    No driving for 2 weeks   Increase activity slowly   Complete by:  As directed    Lifting restrictions   Complete by:  As directed    No lifting for 2 weeks   Sexual Activity Restrictions   Complete by:  As directed    None for 2 weeks      Discharge Medications   Allergies as of 06/08/2018      Reactions   Lisinopril Rash         Medication List    STOP taking these medications   metoprolol tartrate 25 MG tablet Commonly known as:  LOPRESSOR     TAKE these medications   aspirin EC 81 MG tablet Take 1 tablet (81 mg total) by mouth daily. Notes to patient:  vin nn aspirin EC 81 MG U?ng 1 vin (t?ng c?ng 81 mg) b?ng du?ng u?ng hng ngy. Li?u ti?p theo do:   atorvastatin 80 MG tablet Commonly known as:  LIPITOR Take 1 tablet (80 mg total) by mouth daily at 6 PM. Notes to patient:  my tnh b?ng atorvastatin 80 MG Thu?ng du?c g?i l: LIPITOR U?ng 1 vin (t?ng c?ng 80 mg) b?ng du?ng u?ng hng ngy lc 6 gi? chi?u.   carvedilol 3.125 MG tablet Commonly known as:  COREG Take 1 tablet (3.125 mg total) by mouth 2 (two) times daily with a meal. Notes to patient:  my tnh b?ng carvedilol 3.125 MG Thu?ng du?c g?i l: COREG U?ng 1 vin (t?ng c?ng 3.125 mg) b?ng mi?ng 2 (hai) l?n m?i ngy v?i b?a an.   nitroGLYCERIN 0.4 MG SL tablet Commonly known as:  NITROSTAT Place 1 tablet (0.4 mg total) under the tongue every 5 (five)  minutes as needed for chest pain. Notes to patient:  vin nn nitroGLYCERIN 0,4 MG SL Thu?ng du?c g?i l: NITROSTAT ?t 1 vin (t?ng c?ng 0,4 mg) du?i lu?i c? sau 5 (nam) pht khi c?n thi?t cho dau ng?c.   ticagrelor 90 MG Tabs tablet Commonly known as:  BRILINTA Take 1 tablet (90 mg total) by mouth 2 (two) times daily. Notes to patient:  my tnh b?ng ticagrelor 90 MG Thu?ng du?c g?i  l: BRILINTA U?ng 1 vin (t?ng c?ng 90 mg) b?ng mi?ng 2 (hai) l?n m?i ngy.            Discharge Care Instructions  (From admission, onward)         Start     Ordered   06/08/18 0000  Discharge wound care:    Comments:  Call [phone] for any swelling bleeding bruising or fever   06/08/18 1223           Acute coronary syndrome (MI, NSTEMI, STEMI, etc) this admission?: Yes.     AHA/ACC Clinical Performance & Quality Measures: 1. Aspirin prescribed? - Yes 2. ADP Receptor Inhibitor (Plavix/Clopidogrel, Brilinta/Ticagrelor or Effient/Prasugrel) prescribed (includes medically managed patients)? - Yes 3. Beta Blocker prescribed? - Yes 4. High Intensity Statin (Lipitor 40-80mg  or Crestor 20-40mg ) prescribed? - Yes 5. EF assessed during THIS hospitalization? - Yes 6. For EF <40%, was ACEI/ARB prescribed? - Not Applicable (EF >/= 40%) 7. For EF <40%, Aldosterone Antagonist (Spironolactone or Eplerenone) prescribed? - Not Applicable (EF >/= 40%) 8. Cardiac Rehab Phase II ordered (Included Medically managed Patients)? - Yes     Outstanding Labs/Studies    Lipids and LFTs 4-6 weeks (will need to arrange at follow up appt).  Duration of Discharge Encounter   Greater than 30 minutes including physician time.  Signed, Tereso Newcomer, PA-C 06/08/2018, 12:23 PM

## 2018-06-08 NOTE — Progress Notes (Signed)
  Echocardiogram 2D Echocardiogram has been performed.  Caleb [name], Caleb [name] R 06/08/2018, 11:58 AM

## 2018-06-08 NOTE — Progress Notes (Signed)
Progress Note  Patient Name: Ariez Koon Date of Encounter: 06/08/2018  Primary Cardiologist: Dr Eldridge Dace  Subjective   No chest pain or dyspnea  Inpatient Medications    Scheduled Meds: . aspirin  324 mg Oral Once  . aspirin  81 mg Oral Daily  . atorvastatin  80 mg Oral q1800  . carvedilol  3.125 mg Oral BID WC  . sodium chloride flush  3 mL Intravenous Q12H  . ticagrelor  90 mg Oral BID   Continuous Infusions: . sodium chloride 100 mL/hr at 06/06/18 0545  . sodium chloride     PRN Meds: sodium chloride, acetaminophen, ondansetron (ZOFRAN) IV, sodium chloride flush   Vital Signs    Vitals:   06/07/18 1123 06/07/18 1600 06/07/18 1706 06/08/18 0520  BP: 101/66 101/68 109/72 104/69  Pulse:   70 67  Resp: 14 19 16 15   Temp: 97.9 F (36.6 C) 98.9 F (37.2 C) 98.5 F (36.9 C) 98.3 F (36.8 C)  TempSrc: Oral Oral Oral Oral  SpO2: 99% 99% 97% 96%  Weight:   64.5 kg   Height:        Intake/Output Summary (Last 24 hours) at 06/08/2018 1038 Last data filed at 06/07/2018 2200 Gross per 24 hour  Intake 123 ml  Output 750 ml  Net -627 ml   Filed Weights   06/06/18 0537 06/07/18 1706  Weight: 70.8 kg 64.5 kg    Telemetry    Sinus- Personally Reviewed   Physical Exam   GEN: No acute distress.   Neck: No JVD Cardiac: RRR, no murmurs, rubs, or gallops.  Respiratory: Clear to auscultation bilaterally. GI: Soft, nontender, non-distended  MS: No edema; radial cath site with no hematoma Neuro:  Nonfocal  Psych: Normal affect   Labs    Chemistry Recent Labs  Lab 06/06/18 0534 06/06/18 0613 06/06/18 1020 06/07/18 0111  NA 142 141 142 139  K 5.8* 3.5 4.0 3.6  CL 106 106 110 107  CO2 21*  --  19* 24  GLUCOSE 112* 123* 115* 109*  BUN 22* 23* 18 17  CREATININE 1.40* 1.00 1.06 1.05  CALCIUM 8.5*  --  8.5* 8.5*  PROT 6.6  --   --   --   ALBUMIN 3.5  --   --   --   AST 52*  --   --   --   ALT 39  --   --   --   ALKPHOS 80  --   --   --   BILITOT 1.6*   --   --   --   GFRNONAA 57*  --  >60 >60  GFRAA >60  --  >60 >60  ANIONGAP 15  --  13 8     Hematology Recent Labs  Lab 06/06/18 0534 06/06/18 0613 06/07/18 0111  WBC 13.6*  --  10.0  RBC 5.54  --  5.10  HGB 14.2 15.0 13.1  HCT 46.9 44.0 42.2  MCV 84.7  --  82.7  MCH 25.6*  --  25.7*  MCHC 30.3  --  31.0  RDW 13.3  --  13.5  PLT 227  --  207    Cardiac Enzymes Recent Labs  Lab 06/06/18 0534 06/06/18 1020 06/06/18 1857 06/07/18 0111  TROPONINI <0.03 >65.00* >65.00* >65.00*    Recent Labs  Lab 06/06/18 0534  TROPIPOC 0.02     Patient Profile     52 y.o. male with past medical history of coronary artery disease  admitted with inferior infarct secondary to late stent thrombosis of distal right coronary artery.  Patient had atherectomy and PCI.  Assessment & Plan    1 coronary artery disease-patient doing well with no chest pain.  Status post PCI of RCA.  Continue aspirin, Brilinta and statin.  Continue beta-blocker.  2 hyperlipidemia-continue Lipitor at present dose.  Check lipids and liver in 4 weeks.  Discharge today with transition of care appointment in 1 week with APP.  Follow-up Dr. Eldridge DaceVaranasi 3 months.  Greater than 30 minutes PA and physician time. D2  For questions or updates, please contact [facility] Please consult [email or web address] for contact info under Cardiology/STEMI.      Signed, Olga MillersBrian Fahima Cifelli, MD  06/08/2018, 10:38 AM

## 2018-06-11 ENCOUNTER — Telehealth: Payer: Self-pay | Admitting: Nurse Practitioner

## 2018-06-11 MED FILL — Nitroglycerin IV Soln 100 MCG/ML in D5W: INTRA_ARTERIAL | Qty: 10 | Status: AC

## 2018-06-11 NOTE — Telephone Encounter (Signed)
New Message    [facility] Norm[facility] FredricksonLori Gerhardt

## 2018-06-11 NOTE — Telephone Encounter (Signed)
1st attempted TCM call: I left a message on the pts VM asking him to return this call.

## 2018-06-12 NOTE — Telephone Encounter (Signed)
Attempted to contact patient with interpreter. Unable to reach patient.

## 2018-06-16 ENCOUNTER — Telehealth (HOSPITAL_COMMUNITY): Payer: Self-pay

## 2018-06-16 NOTE — Telephone Encounter (Signed)
Attempted to call patient in regards to Cardiac Rehab - LM on VM 

## 2018-06-16 NOTE — Telephone Encounter (Signed)
Pt insurance is active and benefits verified through [facility]. Co-pay $0.00, DED $4,000.00/$0.00 met, out of pocket $6,500.00/$0.00 met, co-insurance 35%. No pre-authorization. Cindy/[facility], 06/16/18 @ 2:52PM, REF#[id number]  Will contact patient to see if he is interested in the Cardiac Rehab Program. If interested, patient will need to complete follow up appt. Once completed, patient will be contacted for scheduling upon review by the RN Navigator.

## 2018-06-24 ENCOUNTER — Encounter: Payer: Self-pay | Admitting: Nurse Practitioner

## 2018-06-25 ENCOUNTER — Ambulatory Visit: Payer: Commercial Managed Care - PPO | Admitting: Nurse Practitioner

## 2018-06-25 NOTE — Progress Notes (Deleted)
CARDIOLOGY OFFICE NOTE  Date:  06/25/2018    Caleb [name] Date of Birth: 01/01/1966 Medical Record #[id number]  PCP:  Patient, No Pcp Per  Cardiologist:  Excell Seltzerooper   No chief complaint on file.   History of Present Illness: Caleb [name] is a 52 y.o. male who presents today for a post hospital visit. Seen for Dr. Excell Seltzerooper.   He has a [place] male with a history of coronary artery disease status post inferior ST elevation myocardial infarction in 2016 treated with a drug-eluting stent to the RCA and staged intervention with drug-eluting stent x2 to the proximal and distal LAD. Other issues include ischemic cardiomyopathy & hyperlipidemia.    He was last seen here in 2016.   He presented to the hospital earlier this month after awakening with sudden onset chest pain.  ECG demonstrated inferior ST elevation and reciprocal changes in the lateral leads.  He was taken emergently to the cardiac catheterization lab for inferior ST elevation myocardial infarction.  Cardiac catheterization demonstrated distal RCA stent thrombosis.  The patient was treated with thrombectomy and placement of a Synergy DES.  Uninterrupted dual anti-platelet therapy with aspirin and Ticagrelor was recommended for minimum of 12 months.  After 1 year, it is felt that consideration should be given to transition ticagrelor to clopidogrel given his presentation with late stent thrombosis.  Post PCI, he was bradycardic and hypotensive.  He was not placed on beta-blocker and Levophed was used for hemodynamic support.  His blood pressure and heart rate improved and he was started on low-dose beta-blocker the next day.  He did not have further chest pain.  Echocardiogram showed EF 45-50% with inferolateral and inferior akinesis and mild diastolic dysfunction.    Comes in today. Here with   Past Medical History:  Diagnosis Date  . CAD (coronary artery disease) 09/03/2015   Inf STEMI 2016 tx with DES to RCA and staged PCI with DES x 2  to LAD // Inf STEMI 8/19 - 2/2 stent thrombosis - LHC:  LAD stents patent, OM1 90, dRCA 100 >> PCI:  Thrombectomy and DES to dRCA  . Coronary artery disease    a. Inf STEMI 08/2015 s/p DES to RCA then staged DESx2 to prox and distal LAD;  residual 90% small OM1 which is being treated medically.  . Former tobacco use   . Hyperlipemia   . Ischemic cardiomyopathy    a. 2D echo 09/02/15: mild LVH, EF 45-50%, mild HK of inferolateral myocardium, grade 1 DD, mild AI/MR.  . ST elevation myocardial infarction (STEMI) of inferior wall (HCC) 08/31/2015    Past Surgical History:  Procedure Laterality Date  . CARDIAC CATHETERIZATION N/A 08/31/2015   Procedure: Left Heart Cath and Coronary Angiography;  Surgeon: Tonny BollmanMichael Cooper, MD;  [facility];  Service: Cardiovascular;  Laterality: N/A;  . CARDIAC CATHETERIZATION N/A 08/31/2015   Procedure: Coronary Stent Intervention;  Surgeon: Tonny BollmanMichael Cooper, MD;  [facility];  Service: Cardiovascular;  Laterality: N/A;  . CARDIAC CATHETERIZATION N/A 09/02/2015   Procedure: Coronary Stent Intervention;  Surgeon: Corky CraftsJayadeep S Varanasi, MD;  [facility];  Service: Cardiovascular;  Laterality: N/A;  . CARDIAC CATHETERIZATION N/A 09/02/2015   Procedure: Left Heart Cath and Coronary Angiography;  Surgeon: Corky CraftsJayadeep S Varanasi, MD;  [facility];  Service: Cardiovascular;  Laterality: N/A;  . CORONARY/GRAFT ACUTE MI REVASCULARIZATION N/A 06/06/2018   Procedure: Coronary/Graft Acute MI Revascularization;  Surgeon: Corky CraftsVaranasi, Jayadeep S, MD;  Location: [facility] INVASIVE CV LAB;  Service: Cardiovascular;  Laterality: N/A;  . LEFT HEART CATH AND CORONARY ANGIOGRAPHY N/A 06/06/2018   Procedure: LEFT HEART CATH AND CORONARY ANGIOGRAPHY;  Surgeon: Corky Crafts, MD;  Location: [facility] INVASIVE CV LAB;  Service: Cardiovascular;  Laterality: N/A;  . NO PAST SURGERIES       Medications: No outpatient medications have been marked  as taking for the 06/25/18 encounter (Appointment) with Caleb Macadamia, NP.     Allergies: Allergies  Allergen Reactions  . Lisinopril Rash         Social History: The patient  reports that he has quit smoking. He has never used smokeless tobacco. He reports that he does not drink alcohol or use drugs.   Family History: The patient's family history includes Other in his unknown relative.   Review of Systems: Please see the history of present illness.   Otherwise, the review of systems is positive for none.   All other systems are reviewed and negative.   Physical Exam: VS:  There were no vitals taken for this visit. Marland Kitchen  BMI There is no height or weight on file to calculate BMI.  Wt Readings from Last 3 Encounters:  06/07/18 142 lb 1.6 oz (64.5 kg)  09/09/15 128 lb (58.1 kg)  09/02/15 130 lb 1.1 oz (59 kg)    General: Pleasant. Well developed, well nourished and in no acute distress.   HEENT: Normal.  Neck: Supple, no JVD, carotid bruits, or masses noted.  Cardiac: Regular rate and rhythm. No murmurs, rubs, or gallops. No edema.  Respiratory:  Lungs are clear to auscultation bilaterally with normal work of breathing.  GI: Soft and nontender.  MS: No deformity or atrophy. Gait and ROM intact.  Skin: Warm and dry. Color is normal.  Neuro:  Strength and sensation are intact and no gross focal deficits noted.  Psych: Alert, appropriate and with normal affect.   LABORATORY DATA:  EKG:  EKG is not ordered today.  Lab Results  Component Value Date   WBC 10.0 06/07/2018   HGB 13.1 06/07/2018   HCT 42.2 06/07/2018   PLT 207 06/07/2018   GLUCOSE 109 (H) 06/07/2018   CHOL 251 (H) 06/07/2018   TRIG 331 (H) 06/07/2018   HDL 28 (L) 06/07/2018   LDLCALC 157 (H) 06/07/2018   ALT 39 06/06/2018   AST 52 (H) 06/06/2018   NA 139 06/07/2018   K 3.6 06/07/2018   CL 107 06/07/2018   CREATININE 1.05 06/07/2018   BUN 17 06/07/2018   CO2 24 06/07/2018   TSH 0.919 08/31/2015    INR 0.95 06/06/2018   HGBA1C 5.8 (H) 06/07/2018     BNP (last 3 results) No results for input(s): BNP in the last 8760 hours.  ProBNP (last 3 results) No results for input(s): PROBNP in the last 8760 hours.   Other Studies Reviewed Today:  Echocardiogram 06/08/18 Mild LVH, EF 45-50, basal inferolateral and inferior akinesis, grade 1 diastolic dysfunction, trivial AI, trivial MR  Cardiac catheterization 06/06/2018 LM normal LAD proximal stent patent, distal stent patent LCx okay; OM1 90 RCA distal 100-thrombotic PCI: Distal RCA thrombectomy and 4 x 38 mm Synergy DES to the distal RCA          Assessment/Plan:  1. CAD -   2 HLD  3. Ischemic CM  Current medicines are reviewed with the patient today.  The patient does not have concerns regarding medicines other than what has been noted above.  The following changes have been made:  See above.  Labs/ tests ordered today include:   No orders of the defined types were placed in this encounter.    Disposition:   FU with *** in {gen number [id number]} {Days to years:10300}.   Patient is agreeable to this plan and will call if any problems develop in the interim.   SignedNorma Fredrickson, NP  06/25/2018 10:37 AM  [facility] [address] [place], [place]  [address] Phone: [phone] Fax: [phone]

## 2018-07-01 ENCOUNTER — Ambulatory Visit: Payer: Commercial Managed Care - PPO | Admitting: Physician Assistant

## 2023-10-13 ENCOUNTER — Encounter (HOSPITAL_COMMUNITY): Payer: Self-pay

## 2023-10-13 ENCOUNTER — Encounter (HOSPITAL_COMMUNITY): Admission: EM | Disposition: E | Payer: Self-pay | Source: Home / Self Care | Attending: Cardiology

## 2023-10-13 ENCOUNTER — Inpatient Hospital Stay (HOSPITAL_COMMUNITY)
Admission: EM | Admit: 2023-10-13 | Discharge: 2023-10-30 | DRG: 003 | Disposition: E | Payer: Commercial Managed Care - PPO | Attending: Cardiology | Admitting: Cardiology

## 2023-10-13 ENCOUNTER — Emergency Department (HOSPITAL_COMMUNITY): Payer: Commercial Managed Care - PPO

## 2023-10-13 ENCOUNTER — Inpatient Hospital Stay (HOSPITAL_COMMUNITY): Payer: Commercial Managed Care - PPO

## 2023-10-13 ENCOUNTER — Other Ambulatory Visit: Payer: Self-pay

## 2023-10-13 DIAGNOSIS — Z7902 Long term (current) use of antithrombotics/antiplatelets: Secondary | ICD-10-CM

## 2023-10-13 DIAGNOSIS — Z91199 Patient's noncompliance with other medical treatment and regimen due to unspecified reason: Secondary | ICD-10-CM

## 2023-10-13 DIAGNOSIS — N179 Acute kidney failure, unspecified: Secondary | ICD-10-CM | POA: Diagnosis not present

## 2023-10-13 DIAGNOSIS — I2109 ST elevation (STEMI) myocardial infarction involving other coronary artery of anterior wall: Secondary | ICD-10-CM | POA: Diagnosis present

## 2023-10-13 DIAGNOSIS — I252 Old myocardial infarction: Secondary | ICD-10-CM

## 2023-10-13 DIAGNOSIS — I5082 Biventricular heart failure: Secondary | ICD-10-CM | POA: Diagnosis present

## 2023-10-13 DIAGNOSIS — Z888 Allergy status to other drugs, medicaments and biological substances status: Secondary | ICD-10-CM

## 2023-10-13 DIAGNOSIS — R008 Other abnormalities of heart beat: Secondary | ICD-10-CM | POA: Diagnosis present

## 2023-10-13 DIAGNOSIS — Z87891 Personal history of nicotine dependence: Secondary | ICD-10-CM

## 2023-10-13 DIAGNOSIS — K559 Vascular disorder of intestine, unspecified: Secondary | ICD-10-CM | POA: Diagnosis not present

## 2023-10-13 DIAGNOSIS — I2510 Atherosclerotic heart disease of native coronary artery without angina pectoris: Secondary | ICD-10-CM | POA: Diagnosis present

## 2023-10-13 DIAGNOSIS — E872 Acidosis, unspecified: Secondary | ICD-10-CM | POA: Diagnosis present

## 2023-10-13 DIAGNOSIS — J9601 Acute respiratory failure with hypoxia: Secondary | ICD-10-CM | POA: Diagnosis present

## 2023-10-13 DIAGNOSIS — E1165 Type 2 diabetes mellitus with hyperglycemia: Secondary | ICD-10-CM | POA: Diagnosis present

## 2023-10-13 DIAGNOSIS — E1122 Type 2 diabetes mellitus with diabetic chronic kidney disease: Secondary | ICD-10-CM | POA: Diagnosis present

## 2023-10-13 DIAGNOSIS — I4901 Ventricular fibrillation: Secondary | ICD-10-CM | POA: Diagnosis present

## 2023-10-13 DIAGNOSIS — J9600 Acute respiratory failure, unspecified whether with hypoxia or hypercapnia: Secondary | ICD-10-CM | POA: Diagnosis not present

## 2023-10-13 DIAGNOSIS — R570 Cardiogenic shock: Secondary | ICD-10-CM | POA: Diagnosis present

## 2023-10-13 DIAGNOSIS — L7632 Postprocedural hematoma of skin and subcutaneous tissue following other procedure: Secondary | ICD-10-CM | POA: Diagnosis present

## 2023-10-13 DIAGNOSIS — E785 Hyperlipidemia, unspecified: Secondary | ICD-10-CM | POA: Diagnosis present

## 2023-10-13 DIAGNOSIS — E669 Obesity, unspecified: Secondary | ICD-10-CM | POA: Diagnosis present

## 2023-10-13 DIAGNOSIS — I5021 Acute systolic (congestive) heart failure: Secondary | ICD-10-CM | POA: Diagnosis present

## 2023-10-13 DIAGNOSIS — N170 Acute kidney failure with tubular necrosis: Secondary | ICD-10-CM | POA: Diagnosis present

## 2023-10-13 DIAGNOSIS — I213 ST elevation (STEMI) myocardial infarction of unspecified site: Secondary | ICD-10-CM | POA: Diagnosis not present

## 2023-10-13 DIAGNOSIS — I469 Cardiac arrest, cause unspecified: Secondary | ICD-10-CM | POA: Diagnosis not present

## 2023-10-13 DIAGNOSIS — K7200 Acute and subacute hepatic failure without coma: Secondary | ICD-10-CM | POA: Diagnosis present

## 2023-10-13 DIAGNOSIS — Z79899 Other long term (current) drug therapy: Secondary | ICD-10-CM

## 2023-10-13 DIAGNOSIS — I462 Cardiac arrest due to underlying cardiac condition: Secondary | ICD-10-CM | POA: Diagnosis present

## 2023-10-13 DIAGNOSIS — Z515 Encounter for palliative care: Secondary | ICD-10-CM

## 2023-10-13 DIAGNOSIS — I219 Acute myocardial infarction, unspecified: Secondary | ICD-10-CM | POA: Diagnosis not present

## 2023-10-13 DIAGNOSIS — Z66 Do not resuscitate: Secondary | ICD-10-CM | POA: Diagnosis not present

## 2023-10-13 DIAGNOSIS — I249 Acute ischemic heart disease, unspecified: Secondary | ICD-10-CM | POA: Diagnosis not present

## 2023-10-13 DIAGNOSIS — D696 Thrombocytopenia, unspecified: Secondary | ICD-10-CM | POA: Diagnosis present

## 2023-10-13 DIAGNOSIS — I472 Ventricular tachycardia, unspecified: Secondary | ICD-10-CM | POA: Diagnosis present

## 2023-10-13 DIAGNOSIS — Z9281 Personal history of extracorporeal membrane oxygenation (ECMO): Secondary | ICD-10-CM | POA: Diagnosis not present

## 2023-10-13 DIAGNOSIS — I2102 ST elevation (STEMI) myocardial infarction involving left anterior descending coronary artery: Secondary | ICD-10-CM | POA: Diagnosis present

## 2023-10-13 DIAGNOSIS — R251 Tremor, unspecified: Secondary | ICD-10-CM | POA: Diagnosis present

## 2023-10-13 DIAGNOSIS — J690 Pneumonitis due to inhalation of food and vomit: Secondary | ICD-10-CM | POA: Diagnosis present

## 2023-10-13 DIAGNOSIS — M6282 Rhabdomyolysis: Secondary | ICD-10-CM | POA: Diagnosis present

## 2023-10-13 DIAGNOSIS — D631 Anemia in chronic kidney disease: Secondary | ICD-10-CM | POA: Diagnosis present

## 2023-10-13 DIAGNOSIS — I255 Ischemic cardiomyopathy: Secondary | ICD-10-CM | POA: Diagnosis present

## 2023-10-13 DIAGNOSIS — Z603 Acculturation difficulty: Secondary | ICD-10-CM | POA: Diagnosis present

## 2023-10-13 DIAGNOSIS — Y849 Medical procedure, unspecified as the cause of abnormal reaction of the patient, or of later complication, without mention of misadventure at the time of the procedure: Secondary | ICD-10-CM | POA: Diagnosis present

## 2023-10-13 DIAGNOSIS — Z955 Presence of coronary angioplasty implant and graft: Secondary | ICD-10-CM

## 2023-10-13 DIAGNOSIS — D62 Acute posthemorrhagic anemia: Secondary | ICD-10-CM | POA: Diagnosis present

## 2023-10-13 DIAGNOSIS — Z6830 Body mass index (BMI) 30.0-30.9, adult: Secondary | ICD-10-CM

## 2023-10-13 DIAGNOSIS — R569 Unspecified convulsions: Secondary | ICD-10-CM | POA: Diagnosis not present

## 2023-10-13 DIAGNOSIS — E876 Hypokalemia: Secondary | ICD-10-CM | POA: Diagnosis present

## 2023-10-13 DIAGNOSIS — R001 Bradycardia, unspecified: Secondary | ICD-10-CM | POA: Diagnosis present

## 2023-10-13 HISTORY — PX: LEFT HEART CATH AND CORONARY ANGIOGRAPHY: CATH118249

## 2023-10-13 HISTORY — PX: CORONARY/GRAFT ACUTE MI REVASCULARIZATION: CATH118305

## 2023-10-13 LAB — POCT I-STAT 7, (LYTES, BLD GAS, ICA,H+H)
Acid-base deficit: 13 mmol/L — ABNORMAL HIGH (ref 0.0–2.0)
Acid-base deficit: 2 mmol/L (ref 0.0–2.0)
Acid-base deficit: 5 mmol/L — ABNORMAL HIGH (ref 0.0–2.0)
Acid-base deficit: 5 mmol/L — ABNORMAL HIGH (ref 0.0–2.0)
Acid-base deficit: 6 mmol/L — ABNORMAL HIGH (ref 0.0–2.0)
Acid-base deficit: 10 mmol/L — ABNORMAL HIGH (ref 0.0–2.0)
Acid-base deficit: 11 mmol/L — ABNORMAL HIGH (ref 0.0–2.0)
Acid-base deficit: 11 mmol/L — ABNORMAL HIGH (ref 0.0–2.0)
Acid-base deficit: 7 mmol/L — ABNORMAL HIGH (ref 0.0–2.0)
Acid-base deficit: 5 mmol/L — ABNORMAL HIGH (ref 0.0–2.0)
Acid-base deficit: 4 mmol/L — ABNORMAL HIGH (ref 0.0–2.0)
Acid-base deficit: 3 mmol/L — ABNORMAL HIGH (ref 0.0–2.0)
Bicarbonate: 17.1 mmol/L — ABNORMAL LOW (ref 20.0–28.0)
Bicarbonate: 26.2 mmol/L (ref 20.0–28.0)
Bicarbonate: 23.1 mmol/L (ref 20.0–28.0)
Bicarbonate: 18.2 mmol/L — ABNORMAL LOW (ref 20.0–28.0)
Bicarbonate: 21.3 mmol/L (ref 20.0–28.0)
Bicarbonate: 14.9 mmol/L — ABNORMAL LOW (ref 20.0–28.0)
Bicarbonate: 16.1 mmol/L — ABNORMAL LOW (ref 20.0–28.0)
Bicarbonate: 13.9 mmol/L — ABNORMAL LOW (ref 20.0–28.0)
Bicarbonate: 17.7 mmol/L — ABNORMAL LOW (ref 20.0–28.0)
Bicarbonate: 19.7 mmol/L — ABNORMAL LOW (ref 20.0–28.0)
Bicarbonate: 19.3 mmol/L — ABNORMAL LOW (ref 20.0–28.0)
Bicarbonate: 20.5 mmol/L (ref 20.0–28.0)
Calcium, Ion: 1.06 mmol/L — ABNORMAL LOW (ref 1.15–1.40)
Calcium, Ion: 0.97 mmol/L — ABNORMAL LOW (ref 1.15–1.40)
Calcium, Ion: 0.93 mmol/L — ABNORMAL LOW (ref 1.15–1.40)
Calcium, Ion: 0.89 mmol/L — CL (ref 1.15–1.40)
Calcium, Ion: 0.95 mmol/L — ABNORMAL LOW (ref 1.15–1.40)
Calcium, Ion: 0.86 mmol/L — CL (ref 1.15–1.40)
Calcium, Ion: 1.19 mmol/L (ref 1.15–1.40)
Calcium, Ion: 1.09 mmol/L — ABNORMAL LOW (ref 1.15–1.40)
Calcium, Ion: 1.06 mmol/L — ABNORMAL LOW (ref 1.15–1.40)
Calcium, Ion: 1.05 mmol/L — ABNORMAL LOW (ref 1.15–1.40)
Calcium, Ion: 1.01 mmol/L — ABNORMAL LOW (ref 1.15–1.40)
Calcium, Ion: 1.01 mmol/L — ABNORMAL LOW (ref 1.15–1.40)
HCT: 35 % — ABNORMAL LOW (ref 39.0–52.0)
HCT: 27 % — ABNORMAL LOW (ref 39.0–52.0)
HCT: 29 % — ABNORMAL LOW (ref 39.0–52.0)
HCT: 30 % — ABNORMAL LOW (ref 39.0–52.0)
HCT: 31 % — ABNORMAL LOW (ref 39.0–52.0)
HCT: 26 % — ABNORMAL LOW (ref 39.0–52.0)
HCT: 26 % — ABNORMAL LOW (ref 39.0–52.0)
HCT: 28 % — ABNORMAL LOW (ref 39.0–52.0)
HCT: 27 % — ABNORMAL LOW (ref 39.0–52.0)
HCT: 28 % — ABNORMAL LOW (ref 39.0–52.0)
HCT: 26 % — ABNORMAL LOW (ref 39.0–52.0)
HCT: 26 % — ABNORMAL LOW (ref 39.0–52.0)
Hemoglobin: 11.9 g/dL — ABNORMAL LOW (ref 13.0–17.0)
Hemoglobin: 9.2 g/dL — ABNORMAL LOW (ref 13.0–17.0)
Hemoglobin: 9.9 g/dL — ABNORMAL LOW (ref 13.0–17.0)
Hemoglobin: 10.2 g/dL — ABNORMAL LOW (ref 13.0–17.0)
Hemoglobin: 10.5 g/dL — ABNORMAL LOW (ref 13.0–17.0)
Hemoglobin: 8.8 g/dL — ABNORMAL LOW (ref 13.0–17.0)
Hemoglobin: 8.8 g/dL — ABNORMAL LOW (ref 13.0–17.0)
Hemoglobin: 9.5 g/dL — ABNORMAL LOW (ref 13.0–17.0)
Hemoglobin: 9.2 g/dL — ABNORMAL LOW (ref 13.0–17.0)
Hemoglobin: 9.5 g/dL — ABNORMAL LOW (ref 13.0–17.0)
Hemoglobin: 8.8 g/dL — ABNORMAL LOW (ref 13.0–17.0)
Hemoglobin: 8.8 g/dL — ABNORMAL LOW (ref 13.0–17.0)
O2 Saturation: 100 %
O2 Saturation: 100 %
O2 Saturation: 100 %
O2 Saturation: 100 %
O2 Saturation: 100 %
O2 Saturation: 87 %
O2 Saturation: 99 %
O2 Saturation: 100 %
O2 Saturation: 100 %
O2 Saturation: 100 %
O2 Saturation: 100 %
O2 Saturation: 100 %
Patient temperature: 24.7
Patient temperature: 36.1
Patient temperature: 36.2
Patient temperature: 36
Patient temperature: 36.1
Patient temperature: 36.4
Patient temperature: 36.7
Patient temperature: 36.8
Patient temperature: 36.8
Patient temperature: 36.8
Potassium: 2.3 mmol/L — CL (ref 3.5–5.1)
Potassium: 2.5 mmol/L — CL (ref 3.5–5.1)
Potassium: 3.2 mmol/L — ABNORMAL LOW (ref 3.5–5.1)
Potassium: 2.9 mmol/L — ABNORMAL LOW (ref 3.5–5.1)
Potassium: 3 mmol/L — ABNORMAL LOW (ref 3.5–5.1)
Potassium: 2.8 mmol/L — ABNORMAL LOW (ref 3.5–5.1)
Potassium: 2.9 mmol/L — ABNORMAL LOW (ref 3.5–5.1)
Potassium: 2.7 mmol/L — CL (ref 3.5–5.1)
Potassium: 2.7 mmol/L — CL (ref 3.5–5.1)
Potassium: 2.8 mmol/L — ABNORMAL LOW (ref 3.5–5.1)
Potassium: 2.5 mmol/L — CL (ref 3.5–5.1)
Potassium: 2.6 mmol/L — CL (ref 3.5–5.1)
Sodium: 128 mmol/L — ABNORMAL LOW (ref 135–145)
Sodium: 145 mmol/L (ref 135–145)
Sodium: 139 mmol/L (ref 135–145)
Sodium: 135 mmol/L (ref 135–145)
Sodium: 137 mmol/L (ref 135–145)
Sodium: 140 mmol/L (ref 135–145)
Sodium: 140 mmol/L (ref 135–145)
Sodium: 139 mmol/L (ref 135–145)
Sodium: 140 mmol/L (ref 135–145)
Sodium: 140 mmol/L (ref 135–145)
Sodium: 141 mmol/L (ref 135–145)
Sodium: 140 mmol/L (ref 135–145)
TCO2: 19 mmol/L — ABNORMAL LOW (ref 22–32)
TCO2: 28 mmol/L (ref 22–32)
TCO2: 25 mmol/L (ref 22–32)
TCO2: 19 mmol/L — ABNORMAL LOW (ref 22–32)
TCO2: 23 mmol/L (ref 22–32)
TCO2: 16 mmol/L — ABNORMAL LOW (ref 22–32)
TCO2: 17 mmol/L — ABNORMAL LOW (ref 22–32)
TCO2: 15 mmol/L — ABNORMAL LOW (ref 22–32)
TCO2: 19 mmol/L — ABNORMAL LOW (ref 22–32)
TCO2: 21 mmol/L — ABNORMAL LOW (ref 22–32)
TCO2: 20 mmol/L — ABNORMAL LOW (ref 22–32)
TCO2: 21 mmol/L — ABNORMAL LOW (ref 22–32)
pCO2 arterial: 58 mm[Hg] — ABNORMAL HIGH (ref 32–48)
pCO2 arterial: 62.4 mm[Hg] — ABNORMAL HIGH (ref 32–48)
pCO2 arterial: 34.9 mm[Hg] (ref 32–48)
pCO2 arterial: 31.3 mm[Hg] — ABNORMAL LOW (ref 32–48)
pCO2 arterial: 42.2 mm[Hg] (ref 32–48)
pCO2 arterial: 31 mm[Hg] — ABNORMAL LOW (ref 32–48)
pCO2 arterial: 32.8 mm[Hg] (ref 32–48)
pCO2 arterial: 25 mm[Hg] — ABNORMAL LOW (ref 32–48)
pCO2 arterial: 30.4 mm[Hg] — ABNORMAL LOW (ref 32–48)
pCO2 arterial: 32.3 mm[Hg] (ref 32–48)
pCO2 arterial: 27.3 mm[Hg] — ABNORMAL LOW (ref 32–48)
pCO2 arterial: 29 mm[Hg] — ABNORMAL LOW (ref 32–48)
pH, Arterial: 7.077 — CL (ref 7.35–7.45)
pH, Arterial: 7.232 — ABNORMAL LOW (ref 7.35–7.45)
pH, Arterial: 7.359 (ref 7.35–7.45)
pH, Arterial: 7.307 — ABNORMAL LOW (ref 7.35–7.45)
pH, Arterial: 7.369 (ref 7.35–7.45)
pH, Arterial: 7.286 — ABNORMAL LOW (ref 7.35–7.45)
pH, Arterial: 7.293 — ABNORMAL LOW (ref 7.35–7.45)
pH, Arterial: 7.35 (ref 7.35–7.45)
pH, Arterial: 7.373 (ref 7.35–7.45)
pH, Arterial: 7.392 (ref 7.35–7.45)
pH, Arterial: 7.455 — ABNORMAL HIGH (ref 7.35–7.45)
pH, Arterial: 7.456 — ABNORMAL HIGH (ref 7.35–7.45)
pO2, Arterial: 612 mm[Hg] — ABNORMAL HIGH (ref 83–108)
pO2, Arterial: 633 mm[Hg] — ABNORMAL HIGH (ref 83–108)
pO2, Arterial: 453 mm[Hg] — ABNORMAL HIGH (ref 83–108)
pO2, Arterial: 384 mm[Hg] — ABNORMAL HIGH (ref 83–108)
pO2, Arterial: 465 mm[Hg] — ABNORMAL HIGH (ref 83–108)
pO2, Arterial: 171 mm[Hg] — ABNORMAL HIGH (ref 83–108)
pO2, Arterial: 56 mm[Hg] — ABNORMAL LOW (ref 83–108)
pO2, Arterial: 286 mm[Hg] — ABNORMAL HIGH (ref 83–108)
pO2, Arterial: 492 mm[Hg] — ABNORMAL HIGH (ref 83–108)
pO2, Arterial: 302 mm[Hg] — ABNORMAL HIGH (ref 83–108)
pO2, Arterial: 270 mm[Hg] — ABNORMAL HIGH (ref 83–108)
pO2, Arterial: 196 mm[Hg] — ABNORMAL HIGH (ref 83–108)

## 2023-10-13 LAB — I-STAT VENOUS BLOOD GAS, ED
Acid-base deficit: 1 mmol/L (ref 0.0–2.0)
Bicarbonate: 22.6 mmol/L (ref 20.0–28.0)
Calcium, Ion: 1.05 mmol/L — ABNORMAL LOW (ref 1.15–1.40)
HCT: 38 % — ABNORMAL LOW (ref 39.0–52.0)
Hemoglobin: 12.9 g/dL — ABNORMAL LOW (ref 13.0–17.0)
O2 Saturation: 30 %
Potassium: 3.9 mmol/L (ref 3.5–5.1)
Sodium: 140 mmol/L (ref 135–145)
TCO2: 24 mmol/L (ref 22–32)
pCO2, Ven: 35 mm[Hg] — ABNORMAL LOW (ref 44–60)
pH, Ven: 7.417 (ref 7.25–7.43)
pO2, Ven: 19 mm[Hg] — CL (ref 32–45)

## 2023-10-13 LAB — GLOBAL TEG PANEL
CFF Max Amplitude: 23.9 mm (ref 15–32)
CK with Heparinase (R): 12 min — ABNORMAL HIGH (ref 4.3–8.3)
Citrated Functional Fibrinogen: 436.1 mg/dL (ref 278–581)
Citrated Kaolin (K): 2.3 min — ABNORMAL HIGH (ref 0.8–2.1)
Citrated Kaolin (MA): 63.5 mm (ref 52–69)
Citrated Kaolin (R): >17 min — ABNORMAL HIGH (ref 4.6–9.1)
Citrated Kaolin Angle: 62.6 deg — ABNORMAL LOW (ref 63–78)
Citrated Rapid TEG (MA): 68.1 mm (ref 52–70)

## 2023-10-13 LAB — CBC
HCT: 32.3 % — ABNORMAL LOW (ref 39.0–52.0)
HCT: 32.2 % — ABNORMAL LOW (ref 39.0–52.0)
HCT: 29 % — ABNORMAL LOW (ref 39.0–52.0)
HCT: 28.2 % — ABNORMAL LOW (ref 39.0–52.0)
Hemoglobin: 10 g/dL — ABNORMAL LOW (ref 13.0–17.0)
Hemoglobin: 9.9 g/dL — ABNORMAL LOW (ref 13.0–17.0)
Hemoglobin: 9.2 g/dL — ABNORMAL LOW (ref 13.0–17.0)
Hemoglobin: 9.2 g/dL — ABNORMAL LOW (ref 13.0–17.0)
MCH: 26.3 pg (ref 26.0–34.0)
MCH: 26.3 pg (ref 26.0–34.0)
MCH: 26.7 pg (ref 26.0–34.0)
MCH: 26.7 pg (ref 26.0–34.0)
MCHC: 31 g/dL (ref 30.0–36.0)
MCHC: 30.7 g/dL (ref 30.0–36.0)
MCHC: 31.7 g/dL (ref 30.0–36.0)
MCHC: 32.6 g/dL (ref 30.0–36.0)
MCV: 85 fL (ref 80.0–100.0)
MCV: 85.4 fL (ref 80.0–100.0)
MCV: 84.1 fL (ref 80.0–100.0)
MCV: 81.7 fL (ref 80.0–100.0)
Platelets: 272 10*3/uL (ref 150–400)
Platelets: 274 10*3/uL (ref 150–400)
Platelets: 207 10*3/uL (ref 150–400)
Platelets: 207 10*3/uL (ref 150–400)
RBC: 3.8 MIL/uL — ABNORMAL LOW (ref 4.22–5.81)
RBC: 3.77 MIL/uL — ABNORMAL LOW (ref 4.22–5.81)
RBC: 3.45 MIL/uL — ABNORMAL LOW (ref 4.22–5.81)
RBC: 3.45 MIL/uL — ABNORMAL LOW (ref 4.22–5.81)
RDW: 14 % (ref 11.5–15.5)
RDW: 13.9 % (ref 11.5–15.5)
RDW: 14.1 % (ref 11.5–15.5)
RDW: 14.1 % (ref 11.5–15.5)
WBC: 18.2 10*3/uL — ABNORMAL HIGH (ref 4.0–10.5)
WBC: 18.5 10*3/uL — ABNORMAL HIGH (ref 4.0–10.5)
WBC: 18.1 10*3/uL — ABNORMAL HIGH (ref 4.0–10.5)
WBC: 16.8 10*3/uL — ABNORMAL HIGH (ref 4.0–10.5)
nRBC: 0 % (ref 0.0–0.2)
nRBC: 0 % (ref 0.0–0.2)
nRBC: 0 % (ref 0.0–0.2)
nRBC: 0 % (ref 0.0–0.2)

## 2023-10-13 LAB — BASIC METABOLIC PANEL
Anion gap: 26 — ABNORMAL HIGH (ref 5–15)
Anion gap: 25 — ABNORMAL HIGH (ref 5–15)
BUN: 19 mg/dL (ref 6–20)
BUN: 22 mg/dL — ABNORMAL HIGH (ref 6–20)
CO2: 17 mmol/L — ABNORMAL LOW (ref 22–32)
CO2: 14 mmol/L — ABNORMAL LOW (ref 22–32)
Calcium: 6.9 mg/dL — ABNORMAL LOW (ref 8.9–10.3)
Calcium: 8.2 mg/dL — ABNORMAL LOW (ref 8.9–10.3)
Chloride: 99 mmol/L (ref 98–111)
Chloride: 101 mmol/L (ref 98–111)
Creatinine, Ser: 2.83 mg/dL — ABNORMAL HIGH (ref 0.61–1.24)
Creatinine, Ser: 2.6 mg/dL — ABNORMAL HIGH (ref 0.61–1.24)
GFR, Estimated: 25 mL/min — ABNORMAL LOW (ref >60)
GFR, Estimated: 28 mL/min — ABNORMAL LOW (ref >60)
Glucose, Bld: 539 mg/dL (ref 70–99)
Glucose, Bld: 555 mg/dL (ref 70–99)
Potassium: 3.1 mmol/L — ABNORMAL LOW (ref 3.5–5.1)
Potassium: 2.7 mmol/L — CL (ref 3.5–5.1)
Sodium: 142 mmol/L (ref 135–145)
Sodium: 140 mmol/L (ref 135–145)

## 2023-10-13 LAB — POCT I-STAT, CHEM 8
BUN: 20 mg/dL (ref 6–20)
Calcium, Ion: 1.19 mmol/L (ref 1.15–1.40)
Chloride: 102 mmol/L (ref 98–111)
Creatinine, Ser: 1.9 mg/dL — ABNORMAL HIGH (ref 0.61–1.24)
Glucose, Bld: 315 mg/dL — ABNORMAL HIGH (ref 70–99)
HCT: 31 % — ABNORMAL LOW (ref 39.0–52.0)
Hemoglobin: 10.5 g/dL — ABNORMAL LOW (ref 13.0–17.0)
Potassium: 2.8 mmol/L — ABNORMAL LOW (ref 3.5–5.1)
Sodium: 141 mmol/L (ref 135–145)
TCO2: 20 mmol/L — ABNORMAL LOW (ref 22–32)

## 2023-10-13 LAB — ABO/RH: ABO/RH(D): O POS

## 2023-10-13 LAB — POCT ACTIVATED CLOTTING TIME
Activated Clotting Time: 233 s
Activated Clotting Time: 233 s

## 2023-10-13 LAB — POCT I-STAT EG7
Acid-base deficit: 10 mmol/L — ABNORMAL HIGH (ref 0.0–2.0)
Bicarbonate: 15 mmol/L — ABNORMAL LOW (ref 20.0–28.0)
Calcium, Ion: 1.11 mmol/L — ABNORMAL LOW (ref 1.15–1.40)
HCT: 26 % — ABNORMAL LOW (ref 39.0–52.0)
Hemoglobin: 8.8 g/dL — ABNORMAL LOW (ref 13.0–17.0)
O2 Saturation: 99 %
Potassium: 2.8 mmol/L — ABNORMAL LOW (ref 3.5–5.1)
Sodium: 140 mmol/L (ref 135–145)
TCO2: 16 mmol/L — ABNORMAL LOW (ref 22–32)
pCO2, Ven: 29.5 mm[Hg] — ABNORMAL LOW (ref 44–60)
pH, Ven: 7.315 (ref 7.25–7.43)
pO2, Ven: 128 mm[Hg] — ABNORMAL HIGH (ref 32–45)

## 2023-10-13 LAB — CG4 I-STAT (LACTIC ACID)
Lactic Acid, Venous: 11.4 mmol/L (ref 0.5–1.9)
Lactic Acid, Venous: >15 mmol/L (ref 0.5–1.9)
Lactic Acid, Venous: >15 mmol/L (ref 0.5–1.9)

## 2023-10-13 LAB — HEPATIC FUNCTION PANEL
ALT: 135 U/L — ABNORMAL HIGH (ref 0–44)
AST: 616 U/L — ABNORMAL HIGH (ref 15–41)
Albumin: 1.5 g/dL — ABNORMAL LOW (ref 3.5–5.0)
Alkaline Phosphatase: 73 U/L (ref 38–126)
Bilirubin, Direct: 0.3 mg/dL — ABNORMAL HIGH (ref 0.0–0.2)
Indirect Bilirubin: 0.8 mg/dL (ref 0.3–0.9)
Total Bilirubin: 1.1 mg/dL (ref <1.2)
Total Protein: 3.8 g/dL — ABNORMAL LOW (ref 6.5–8.1)

## 2023-10-13 LAB — I-STAT CHEM 8, ED
BUN: 22 mg/dL — ABNORMAL HIGH (ref 6–20)
Calcium, Ion: 1.04 mmol/L — ABNORMAL LOW (ref 1.15–1.40)
Chloride: 107 mmol/L (ref 98–111)
Creatinine, Ser: 1.9 mg/dL — ABNORMAL HIGH (ref 0.61–1.24)
Glucose, Bld: 190 mg/dL — ABNORMAL HIGH (ref 70–99)
HCT: 40 % (ref 39.0–52.0)
Hemoglobin: 13.6 g/dL (ref 13.0–17.0)
Potassium: 4 mmol/L (ref 3.5–5.1)
Sodium: 139 mmol/L (ref 135–145)
TCO2: 21 mmol/L — ABNORMAL LOW (ref 22–32)

## 2023-10-13 LAB — I-STAT CG4 LACTIC ACID, ED: Lactic Acid, Venous: 3.2 mmol/L (ref 0.5–1.9)

## 2023-10-13 LAB — GLUCOSE, CAPILLARY
Glucose-Capillary: 451 mg/dL — ABNORMAL HIGH (ref 70–99)
Glucose-Capillary: 474 mg/dL — ABNORMAL HIGH (ref 70–99)
Glucose-Capillary: 474 mg/dL — ABNORMAL HIGH (ref 70–99)
Glucose-Capillary: 425 mg/dL — ABNORMAL HIGH (ref 70–99)
Glucose-Capillary: 480 mg/dL — ABNORMAL HIGH (ref 70–99)
Glucose-Capillary: 457 mg/dL — ABNORMAL HIGH (ref 70–99)
Glucose-Capillary: 444 mg/dL — ABNORMAL HIGH (ref 70–99)
Glucose-Capillary: 390 mg/dL — ABNORMAL HIGH (ref 70–99)
Glucose-Capillary: 350 mg/dL — ABNORMAL HIGH (ref 70–99)
Glucose-Capillary: 323 mg/dL — ABNORMAL HIGH (ref 70–99)

## 2023-10-13 LAB — PROTIME-INR
INR: 1.8 — ABNORMAL HIGH (ref 0.8–1.2)
Prothrombin Time: 20.9 s — ABNORMAL HIGH (ref 11.4–15.2)

## 2023-10-13 LAB — APTT
aPTT: >200 s (ref 24–36)
aPTT: 41 s — ABNORMAL HIGH (ref 24–36)

## 2023-10-13 LAB — FIBRINOGEN: Fibrinogen: 344 mg/dL (ref 210–475)

## 2023-10-13 LAB — PREPARE RBC (CROSSMATCH)

## 2023-10-13 LAB — PHOSPHORUS: Phosphorus: 2.6 mg/dL (ref 2.5–4.6)

## 2023-10-13 LAB — LACTATE DEHYDROGENASE: LDH: 1317 U/L — ABNORMAL HIGH (ref 98–192)

## 2023-10-13 LAB — MRSA NEXT GEN BY PCR, NASAL: MRSA by PCR Next Gen: NOT DETECTED

## 2023-10-13 SURGERY — CORONARY/GRAFT ACUTE MI REVASCULARIZATION
Anesthesia: LOCAL

## 2023-10-13 MED ORDER — EPINEPHRINE 1 MG/10ML IJ SOSY
PREFILLED_SYRINGE | INTRAMUSCULAR | Status: AC
  Filled 2023-10-13: qty 10

## 2023-10-13 MED ORDER — EPINEPHRINE 1 MG/10ML IJ SOSY
PREFILLED_SYRINGE | INTRAMUSCULAR | Status: AC | PRN
  Administered 2023-10-13: 1 mg via INTRAVENOUS

## 2023-10-13 MED ORDER — MAGNESIUM SULFATE 50 % IJ SOLN
INTRAMUSCULAR | Status: DC | PRN
  Administered 2023-10-13 (×3): 2 g via INTRAVENOUS

## 2023-10-13 MED ORDER — AMIODARONE HCL IN DEXTROSE 360-4.14 MG/200ML-% IV SOLN
INTRAVENOUS | Status: AC
  Filled 2023-10-13: qty 200

## 2023-10-13 MED ORDER — TICAGRELOR 90 MG PO TABS
ORAL_TABLET | ORAL | Status: DC | PRN
  Administered 2023-10-13: 180 mg via NASOGASTRIC

## 2023-10-13 MED ORDER — HEPARIN SODIUM (PORCINE) 1000 UNIT/ML IJ SOLN
INTRAMUSCULAR | Status: DC | PRN
  Administered 2023-10-13 (×2): 5000 [IU] via INTRAVENOUS

## 2023-10-13 MED ORDER — ALBUMIN HUMAN 5 % IV SOLN
INTRAVENOUS | Status: AC
  Administered 2023-10-13: 12.5 g
  Filled 2023-10-13: qty 500

## 2023-10-13 MED ORDER — MAGNESIUM SULFATE 50 % IJ SOLN
INTRAMUSCULAR | Status: AC
  Filled 2023-10-13: qty 2

## 2023-10-13 MED ORDER — ORAL CARE MOUTH RINSE: 15.0000 mL | OROMUCOSAL | Status: DC | PRN

## 2023-10-13 MED ORDER — SODIUM BICARBONATE 8.4 % IV SOLN: INTRAVENOUS | Status: DC

## 2023-10-13 MED ORDER — NOREPINEPHRINE 16 MG/250ML-% IV SOLN
0.0000 ug/min | INTRAVENOUS | Status: DC
  Administered 2023-10-13: 29.973 ug/min via INTRAVENOUS
  Administered 2023-10-13: 10 ug/min via INTRAVENOUS
  Administered 2023-10-14: 18 ug/min via INTRAVENOUS
  Administered 2023-10-14: 27 ug/min via INTRAVENOUS
  Administered 2023-10-15: 14 ug/min via INTRAVENOUS
  Administered 2023-10-16: 28 ug/min via INTRAVENOUS
  Filled 2023-10-13 (×6): qty 250

## 2023-10-13 MED ORDER — LIDOCAINE IN D5W 4-5 MG/ML-% IV SOLN
1.0000 mg/min | INTRAVENOUS | Status: DC
  Administered 2023-10-14: 1 mg/min via INTRAVENOUS
  Filled 2023-10-13: qty 500

## 2023-10-13 MED ORDER — AMIODARONE IV BOLUS ONLY 150 MG/100ML
INTRAVENOUS | Status: AC | PRN
  Administered 2023-10-13: 300 mg via INTRAVENOUS

## 2023-10-13 MED ORDER — SODIUM BICARBONATE 8.4 % IV SOLN
INTRAVENOUS | Status: AC
  Filled 2023-10-13: qty 50

## 2023-10-13 MED ORDER — EPINEPHRINE HCL 5 MG/250ML IV SOLN IN NS
INTRAVENOUS | Status: AC
  Filled 2023-10-13: qty 250

## 2023-10-13 MED ORDER — IOHEXOL 350 MG/ML SOLN
75.0000 mL | Freq: Once | INTRAVENOUS | Status: AC | PRN
  Administered 2023-10-13: 75 mL via INTRAVENOUS

## 2023-10-13 MED ORDER — ALBUMIN HUMAN 5 % IV SOLN
INTRAVENOUS | Status: AC
  Filled 2023-10-13: qty 250

## 2023-10-13 MED ORDER — SODIUM CHLORIDE 0.9% FLUSH: 3.0000 mL | INTRAVENOUS | Status: DC | PRN

## 2023-10-13 MED ORDER — SODIUM BICARBONATE 8.4 % IV SOLN
INTRAVENOUS | Status: AC
  Administered 2023-10-13: 50 meq via INTRAVENOUS
  Filled 2023-10-13: qty 150

## 2023-10-13 MED ORDER — CHLORHEXIDINE GLUCONATE CLOTH 2 % EX PADS
6.0000 | MEDICATED_PAD | Freq: Every day | CUTANEOUS | Status: DC
  Administered 2023-10-14 – 2023-10-15 (×2): 6 via TOPICAL

## 2023-10-13 MED ORDER — NOREPINEPHRINE 4 MG/250ML-% IV SOLN
2.0000 ug/min | INTRAVENOUS | Status: DC
  Administered 2023-10-13: 5 ug/min via INTRAVENOUS

## 2023-10-13 MED ORDER — SODIUM BICARBONATE 8.4 % IV SOLN
INTRAVENOUS | Status: DC | PRN
  Administered 2023-10-13 (×5): 50 meq via INTRAVENOUS

## 2023-10-13 MED ORDER — CALCIUM CHLORIDE 10 % IV SOLN: 2.0000 g | Freq: Once | INTRAVENOUS | Status: AC

## 2023-10-13 MED ORDER — NOREPINEPHRINE 4 MG/250ML-% IV SOLN
0.0000 ug/min | INTRAVENOUS | Status: DC
  Filled 2023-10-13: qty 250

## 2023-10-13 MED ORDER — VANCOMYCIN HCL IN DEXTROSE 1-5 GM/200ML-% IV SOLN: 1000.0000 mg | Freq: Once | INTRAVENOUS | Status: DC

## 2023-10-13 MED ORDER — LABETALOL HCL 5 MG/ML IV SOLN
10.0000 mg | INTRAVENOUS | Status: AC | PRN
Start: 2023-10-13 — End: 2023-10-13

## 2023-10-13 MED ORDER — SODIUM BICARBONATE 8.4 % IV SOLN: 50.0000 meq | Freq: Once | INTRAVENOUS | Status: AC

## 2023-10-13 MED ORDER — MILRINONE LACTATE IN DEXTROSE 20-5 MG/100ML-% IV SOLN
INTRAVENOUS | Status: AC
  Filled 2023-10-13: qty 100

## 2023-10-13 MED ORDER — VANCOMYCIN HCL IN DEXTROSE 1-5 GM/200ML-% IV SOLN
1000.0000 mg | Freq: Once | INTRAVENOUS | Status: AC
  Administered 2023-10-13: 1000 mg via INTRAVENOUS
  Filled 2023-10-13: qty 200

## 2023-10-13 MED ORDER — ROCURONIUM BROMIDE 10 MG/ML (PF) SYRINGE
PREFILLED_SYRINGE | INTRAVENOUS | Status: AC
  Administered 2023-10-13: 100 mg
  Filled 2023-10-13: qty 10

## 2023-10-13 MED ORDER — FENTANYL 2500MCG IN NS 250ML (10MCG/ML) PREMIX INFUSION
INTRAVENOUS | Status: AC
  Filled 2023-10-13: qty 250

## 2023-10-13 MED ORDER — ORAL CARE MOUTH RINSE
15.0000 mL | OROMUCOSAL | Status: DC
  Administered 2023-10-13 – 2023-10-16 (×32): 15 mL via OROMUCOSAL

## 2023-10-13 MED ORDER — SODIUM CHLORIDE 0.9 % IV SOLN
1.0000 g | Freq: Once | INTRAVENOUS | Status: AC
  Administered 2023-10-13: 1 g via INTRAVENOUS
  Filled 2023-10-13: qty 20

## 2023-10-13 MED ORDER — TICAGRELOR 90 MG PO TABS
ORAL_TABLET | ORAL | Status: AC
  Filled 2023-10-13: qty 2

## 2023-10-13 MED ORDER — EPINEPHRINE 1 MG/10ML IJ SOSY
PREFILLED_SYRINGE | INTRAMUSCULAR | Status: AC
  Filled 2023-10-13: qty 30

## 2023-10-13 MED ORDER — CALCIUM CHLORIDE 10 % IV SOLN
INTRAVENOUS | Status: AC
  Filled 2023-10-13: qty 10

## 2023-10-13 MED ORDER — LIDOCAINE HCL (CARDIAC) PF 100 MG/5ML IV SOSY
PREFILLED_SYRINGE | INTRAVENOUS | Status: DC | PRN
  Administered 2023-10-13: 100 mg via INTRAVENOUS

## 2023-10-13 MED ORDER — MAGNESIUM SULFATE 4 GM/100ML IV SOLN
INTRAVENOUS | Status: AC
  Filled 2023-10-13: qty 100

## 2023-10-13 MED ORDER — LIDOCAINE HCL (CARDIAC) PF 100 MG/5ML IV SOSY
PREFILLED_SYRINGE | INTRAVENOUS | Status: AC
  Filled 2023-10-13: qty 10

## 2023-10-13 MED ORDER — SODIUM BICARBONATE 8.4 % IV SOLN
INTRAVENOUS | Status: AC | PRN
  Administered 2023-10-13: 100 meq via INTRAVENOUS

## 2023-10-13 MED ORDER — POTASSIUM CHLORIDE 10 MEQ/50ML IV SOLN
10.0000 meq | INTRAVENOUS | Status: AC
Start: 2023-10-13 — End: 2023-10-14
  Administered 2023-10-13 – 2023-10-14 (×6): 10 meq via INTRAVENOUS
  Filled 2023-10-13 (×5): qty 50

## 2023-10-13 MED ORDER — HYDRALAZINE HCL 20 MG/ML IJ SOLN: 10.0000 mg | INTRAMUSCULAR | Status: AC | PRN

## 2023-10-13 MED ORDER — MAGNESIUM SULFATE 2 GM/50ML IV SOLN
INTRAVENOUS | Status: AC
  Filled 2023-10-13: qty 50

## 2023-10-13 MED ORDER — HEPARIN SODIUM (PORCINE) 1000 UNIT/ML DIALYSIS
1000.0000 [IU] | INTRAMUSCULAR | Status: DC | PRN
  Filled 2023-10-13: qty 6
  Filled 2023-10-13: qty 4
  Filled 2023-10-13: qty 3

## 2023-10-13 MED ORDER — POTASSIUM CHLORIDE 10 MEQ/50ML IV SOLN: 10.0000 meq | INTRAVENOUS | Status: DC

## 2023-10-13 MED ORDER — SODIUM CHLORIDE 0.9 % IV SOLN
250.0000 mL | INTRAVENOUS | Status: AC | PRN
  Administered 2023-10-13: 250 mL via INTRAVENOUS

## 2023-10-13 MED ORDER — PRISMASOL BGK 4/2.5 32-4-2.5 MEQ/L EC SOLN: Status: DC

## 2023-10-13 MED ORDER — SODIUM CHLORIDE 0.9% IV SOLUTION: Freq: Once | INTRAVENOUS | Status: AC

## 2023-10-13 MED ORDER — VASOPRESSIN 20 UNITS/100 ML INFUSION FOR SHOCK
0.0000 [IU]/min | INTRAVENOUS | Status: DC
  Administered 2023-10-13 – 2023-10-15 (×6): 0.03 [IU]/min via INTRAVENOUS
  Administered 2023-10-16: 0.04 [IU]/min via INTRAVENOUS
  Filled 2023-10-13 (×7): qty 100

## 2023-10-13 MED ORDER — CANGRELOR TETRASODIUM 50 MG IV SOLR
INTRAVENOUS | Status: AC
  Filled 2023-10-13: qty 50

## 2023-10-13 MED ORDER — POTASSIUM CHLORIDE 10 MEQ/50ML IV SOLN
10.0000 meq | INTRAVENOUS | Status: AC
  Administered 2023-10-13 (×4): 10 meq via INTRAVENOUS
  Filled 2023-10-13 (×3): qty 50

## 2023-10-13 MED ORDER — EPINEPHRINE 0.1 MG/10ML (10 MCG/ML) SYRINGE FOR IV PUSH (FOR BLOOD PRESSURE SUPPORT)
PREFILLED_SYRINGE | INTRAVENOUS | Status: AC | PRN
  Administered 2023-10-13: 15 ug via INTRAVENOUS

## 2023-10-13 MED ORDER — MIDAZOLAM BOLUS VIA INFUSION
0.0000 mg | INTRAVENOUS | Status: DC | PRN
  Administered 2023-10-13 – 2023-10-14 (×2): 2 mg via INTRAVENOUS

## 2023-10-13 MED ORDER — VANCOMYCIN HCL 750 MG/150ML IV SOLN
750.0000 mg | INTRAVENOUS | Status: DC
Start: 2023-10-14 — End: 2023-10-16
  Administered 2023-10-14 – 2023-10-15 (×2): 750 mg via INTRAVENOUS
  Filled 2023-10-13 (×3): qty 150

## 2023-10-13 MED ORDER — FENTANYL 2500MCG IN NS 250ML (10MCG/ML) PREMIX INFUSION
0.0000 ug/h | INTRAVENOUS | Status: DC
  Administered 2023-10-14 – 2023-10-16 (×4): 200 ug/h via INTRAVENOUS
  Filled 2023-10-13 (×5): qty 250

## 2023-10-13 MED ORDER — STERILE WATER FOR INJECTION IV SOLN
INTRAVENOUS | Status: DC
  Filled 2023-10-13 (×3): qty 150

## 2023-10-13 MED ORDER — PANTOPRAZOLE SODIUM 40 MG IV SOLR: 40.0000 mg | Freq: Every day | INTRAVENOUS | Status: DC

## 2023-10-13 MED ORDER — POTASSIUM CHLORIDE 10 MEQ/50ML IV SOLN
INTRAVENOUS | Status: AC
  Filled 2023-10-13: qty 100

## 2023-10-13 MED ORDER — PANTOPRAZOLE SODIUM 40 MG IV SOLR
40.0000 mg | Freq: Every day | INTRAVENOUS | Status: DC
  Administered 2023-10-13 – 2023-10-15 (×3): 40 mg via INTRAVENOUS
  Filled 2023-10-13 (×3): qty 10

## 2023-10-13 MED ORDER — VASOPRESSIN 20 UNITS/100 ML INFUSION FOR SHOCK
INTRAVENOUS | Status: AC
  Filled 2023-10-13: qty 100

## 2023-10-13 MED ORDER — EPINEPHRINE 1 MG/10ML IJ SOSY
PREFILLED_SYRINGE | INTRAMUSCULAR | Status: DC | PRN
  Administered 2023-10-13: 1 mg via INTRAVENOUS

## 2023-10-13 MED ORDER — LIDOCAINE HCL (PF) 1 % IJ SOLN
INTRAMUSCULAR | Status: AC
  Filled 2023-10-13: qty 30

## 2023-10-13 MED ORDER — AMIODARONE LOAD VIA INFUSION
INTRAVENOUS | Status: DC | PRN
  Administered 2023-10-13: 150 mg via INTRAVENOUS

## 2023-10-13 MED ORDER — HEPARIN SODIUM (PORCINE) 1000 UNIT/ML IJ SOLN
INTRAMUSCULAR | Status: AC
  Filled 2023-10-13: qty 20

## 2023-10-13 MED ORDER — NITROGLYCERIN 1 MG/10 ML FOR IR/CATH LAB
INTRA_ARTERIAL | Status: DC | PRN
  Administered 2023-10-13: 200 ug via INTRACORONARY

## 2023-10-13 MED ORDER — PROPOFOL 1000 MG/100ML IV EMUL
INTRAVENOUS | Status: AC
  Filled 2023-10-13: qty 100

## 2023-10-13 MED ORDER — DEXTROSE 50 % IV SOLN
0.0000 mL | INTRAVENOUS | Status: DC | PRN
  Administered 2023-10-15: 50 mL via INTRAVENOUS
  Filled 2023-10-13: qty 50

## 2023-10-13 MED ORDER — ALBUMIN HUMAN 5 % IV SOLN
12.5000 g | Freq: Once | INTRAVENOUS | Status: AC
  Administered 2023-10-13: 12.5 g via INTRAVENOUS

## 2023-10-13 MED ORDER — DEXTROSE-SODIUM CHLORIDE 5-0.45 % IV SOLN: INTRAVENOUS | Status: DC

## 2023-10-13 MED ORDER — HEPARIN (PORCINE) IN NACL 1000-0.9 UT/500ML-% IV SOLN
INTRAVENOUS | Status: DC | PRN
  Administered 2023-10-13 (×3): 500 mL

## 2023-10-13 MED ORDER — CALCIUM CHLORIDE 10 % IV SOLN
INTRAVENOUS | Status: DC | PRN
  Administered 2023-10-13: 1 g via INTRAVENOUS

## 2023-10-13 MED ORDER — POLYETHYLENE GLYCOL 3350 17 G PO PACK
17.0000 g | PACK | Freq: Every day | ORAL | Status: DC
  Administered 2023-10-14 – 2023-10-15 (×2): 17 g
  Filled 2023-10-13 (×2): qty 1

## 2023-10-13 MED ORDER — ASPIRIN 81 MG PO CHEW
CHEWABLE_TABLET | ORAL | Status: DC | PRN
  Administered 2023-10-13: 324 mg via NASOGASTRIC

## 2023-10-13 MED ORDER — ROCURONIUM BROMIDE 10 MG/ML (PF) SYRINGE: 100.0000 mg | PREFILLED_SYRINGE | Freq: Once | INTRAVENOUS | Status: AC

## 2023-10-13 MED ORDER — LIDOCAINE IN D5W 4-5 MG/ML-% IV SOLN
INTRAVENOUS | Status: AC
  Administered 2023-10-13: 1 mg/min via INTRAVENOUS
  Filled 2023-10-13: qty 500

## 2023-10-13 MED ORDER — SODIUM CHLORIDE 0.9 % IV SOLN
INTRAVENOUS | Status: AC | PRN
  Administered 2023-10-13: 4 ug/kg/min via INTRAVENOUS

## 2023-10-13 MED ORDER — EPINEPHRINE HCL 5 MG/250ML IV SOLN IN NS
0.0000 ug/min | INTRAVENOUS | Status: DC
  Administered 2023-10-13 – 2023-10-14 (×3): 11 ug/min via INTRAVENOUS
  Administered 2023-10-14 – 2023-10-15 (×2): 7 ug/min via INTRAVENOUS
  Administered 2023-10-16: 3 ug/min via INTRAVENOUS
  Filled 2023-10-13 (×7): qty 250

## 2023-10-13 MED ORDER — SODIUM BICARBONATE 8.4 % IV SOLN
INTRAVENOUS | Status: AC
  Filled 2023-10-13: qty 200

## 2023-10-13 MED ORDER — SODIUM BICARBONATE 8.4 % IV SOLN
INTRAVENOUS | Status: DC
  Filled 2023-10-13 (×3): qty 25

## 2023-10-13 MED ORDER — AMIODARONE HCL IN DEXTROSE 360-4.14 MG/200ML-% IV SOLN
INTRAVENOUS | Status: AC | PRN
  Administered 2023-10-13: 60 mg/h via INTRAVENOUS

## 2023-10-13 MED ORDER — ASPIRIN 81 MG PO CHEW
CHEWABLE_TABLET | ORAL | Status: AC
  Filled 2023-10-13: qty 3

## 2023-10-13 MED ORDER — VERAPAMIL HCL 2.5 MG/ML IV SOLN
INTRAVENOUS | Status: AC
  Filled 2023-10-13: qty 2

## 2023-10-13 MED ORDER — ASPIRIN 81 MG PO CHEW
CHEWABLE_TABLET | ORAL | Status: AC
  Filled 2023-10-13: qty 1

## 2023-10-13 MED ORDER — SODIUM BICARBONATE 8.4 % IV SOLN
INTRAVENOUS | Status: DC
  Filled 2023-10-13 (×3): qty 1000

## 2023-10-13 MED ORDER — TICAGRELOR 90 MG PO TABS
90.0000 mg | ORAL_TABLET | Freq: Two times a day (BID) | ORAL | Status: DC
  Administered 2023-10-13 – 2023-10-15 (×5): 90 mg
  Filled 2023-10-13 (×5): qty 1

## 2023-10-13 MED ORDER — MIDAZOLAM-SODIUM CHLORIDE 100-0.9 MG/100ML-% IV SOLN
INTRAVENOUS | Status: AC
  Filled 2023-10-13: qty 100

## 2023-10-13 MED ORDER — CANGRELOR BOLUS VIA INFUSION
INTRAVENOUS | Status: DC | PRN
  Administered 2023-10-13: 2178 ug via INTRAVENOUS

## 2023-10-13 MED ORDER — POTASSIUM CHLORIDE 10 MEQ/100ML IV SOLN
INTRAVENOUS | Status: AC
  Filled 2023-10-13: qty 300

## 2023-10-13 MED ORDER — SODIUM CHLORIDE 0.9% FLUSH
10.0000 mL | Freq: Two times a day (BID) | INTRAVENOUS | Status: DC
  Administered 2023-10-13 – 2023-10-15 (×4): 10 mL via INTRAVENOUS

## 2023-10-13 MED ORDER — HEPARIN SODIUM (PORCINE) 1000 UNIT/ML DIALYSIS
1000.0000 [IU] | INTRAMUSCULAR | Status: DC | PRN
  Administered 2023-10-14 – 2023-10-16 (×2): 2800 [IU] via INTRAVENOUS_CENTRAL
  Filled 2023-10-13 (×2): qty 6

## 2023-10-13 MED ORDER — AMIODARONE HCL IN DEXTROSE 360-4.14 MG/200ML-% IV SOLN
60.0000 mg/h | INTRAVENOUS | Status: DC
  Administered 2023-10-13 – 2023-10-16 (×11): 60 mg/h via INTRAVENOUS
  Filled 2023-10-13 (×12): qty 200

## 2023-10-13 MED ORDER — CALCIUM CHLORIDE 10 % IV SOLN
INTRAVENOUS | Status: AC
  Administered 2023-10-13: 2 g via INTRAVENOUS
  Filled 2023-10-13: qty 10

## 2023-10-13 MED ORDER — INSULIN REGULAR(HUMAN) IN NACL 100-0.9 UT/100ML-% IV SOLN
INTRAVENOUS | Status: DC
  Administered 2023-10-13: 4 [IU]/h via INTRAVENOUS
  Administered 2023-10-13: 14 [IU]/h via INTRAVENOUS
  Filled 2023-10-13 (×3): qty 100

## 2023-10-13 MED ORDER — MAGNESIUM SULFATE 50 % IJ SOLN
INTRAMUSCULAR | Status: AC | PRN
  Administered 2023-10-13: 2 g via INTRAVENOUS

## 2023-10-13 MED ORDER — VANCOMYCIN HCL 500 MG/100ML IV SOLN
500.0000 mg | Freq: Once | INTRAVENOUS | Status: AC
  Administered 2023-10-13: 500 mg via INTRAVENOUS
  Filled 2023-10-13: qty 100

## 2023-10-13 MED ORDER — ASPIRIN 81 MG PO CHEW
81.0000 mg | CHEWABLE_TABLET | Freq: Every day | ORAL | Status: DC
  Administered 2023-10-14 – 2023-10-15 (×2): 81 mg
  Filled 2023-10-13 (×2): qty 1

## 2023-10-13 MED ORDER — DOCUSATE SODIUM 50 MG/5ML PO LIQD
100.0000 mg | Freq: Two times a day (BID) | ORAL | Status: DC
  Administered 2023-10-13 – 2023-10-15 (×5): 100 mg
  Filled 2023-10-13 (×5): qty 10

## 2023-10-13 MED ORDER — SODIUM CHLORIDE 0.9% FLUSH
3.0000 mL | Freq: Two times a day (BID) | INTRAVENOUS | Status: DC
  Administered 2023-10-13 – 2023-10-15 (×3): 3 mL via INTRAVENOUS

## 2023-10-13 MED ORDER — SODIUM CHLORIDE 0.9 % IV SOLN
1.0000 g | Freq: Three times a day (TID) | INTRAVENOUS | Status: DC
Start: 2023-10-14 — End: 2023-10-16
  Administered 2023-10-13 – 2023-10-16 (×8): 1 g via INTRAVENOUS
  Filled 2023-10-13 (×8): qty 20

## 2023-10-13 MED ORDER — MIDAZOLAM-SODIUM CHLORIDE 100-0.9 MG/100ML-% IV SOLN
0.0000 mg/h | INTRAVENOUS | Status: DC
  Administered 2023-10-14 – 2023-10-16 (×4): 6 mg/h via INTRAVENOUS
  Filled 2023-10-13 (×4): qty 100

## 2023-10-13 MED ORDER — SODIUM CHLORIDE 0.9 % IV SOLN: 250.0000 mL | INTRAVENOUS | Status: AC

## 2023-10-13 MED ORDER — IOHEXOL 350 MG/ML SOLN
INTRAVENOUS | Status: DC | PRN
  Administered 2023-10-13: 90 mL via INTRA_ARTERIAL

## 2023-10-13 MED ORDER — FENTANYL BOLUS VIA INFUSION
50.0000 ug | INTRAVENOUS | Status: DC | PRN
  Administered 2023-10-13 – 2023-10-14 (×2): 100 ug via INTRAVENOUS

## 2023-10-13 MED ORDER — FENTANYL BOLUS VIA INFUSION
50.0000 ug | Freq: Once | INTRAVENOUS | Status: DC
  Filled 2023-10-13: qty 50

## 2023-10-13 SURGICAL SUPPLY — 36 items
BALLN EMERGE MR 2.5X12 (BALLOONS) ×1
BALLN ~~LOC~~ EMERGE MR 3.5X12 (BALLOONS) ×1
BALLOON EMERGE MR 2.5X12 (BALLOONS) IMPLANT
BALLOON ~~LOC~~ EMERGE MR 3.5X12 (BALLOONS) IMPLANT
CANNULA BIOMEDICUS 19FR SINGLE (MISCELLANEOUS) IMPLANT
CANNULA FEM BIOMEDICUS 25FR (CANNULA) IMPLANT
CATH INFINITI 5FR MULTPACK ANG (CATHETERS) IMPLANT
CATH INFINITI JR4 5F (CATHETERS) IMPLANT
CATH SWAN GANZ VIP 7.5F (CATHETERS) IMPLANT
CATH VISTA GUIDE 6FR XBLAD3.5 (CATHETERS) IMPLANT
DEVICE IMPELLA CP SMRT ASSIST (CATHETERS) IMPLANT
GLIDESHEATH SLEND SS 6F .021 (SHEATH) IMPLANT
GUIDEWIRE INQWIRE 1.5J.035X260 (WIRE) IMPLANT
GUIDEWIRE SAFE TJ AMPLATZ EXST (WIRE) IMPLANT
INQWIRE 1.5J .035X260CM (WIRE) ×1
KIT CV 3L 7FR 20CM SULFAFREE (SET/KITS/TRAYS/PACK) IMPLANT
KIT DILATOR VASC 18G NDL (KITS) IMPLANT
KIT ENCORE 26 ADVANTAGE (KITS) IMPLANT
KIT MICROPUNCTURE NIT STIFF (SHEATH) IMPLANT
KIT SYRINGE INJ CVI SPIKEX1 (MISCELLANEOUS) IMPLANT
PACK CARDIAC CATHETERIZATION (CUSTOM PROCEDURE TRAY) ×1 IMPLANT
PAD DEFIB LIFELINK (PAD) IMPLANT
SET ATX-X65L (MISCELLANEOUS) IMPLANT
SET TUBING HLS ADVANCED 7.0 (TUBING) IMPLANT
SHEATH PINNACLE 6F 10CM (SHEATH) IMPLANT
SHEATH PINNACLE 8F 10CM (SHEATH) IMPLANT
SHEATH PINNACLE 9F 10CM (SHEATH) IMPLANT
SHEATH PROBE COVER 6X72 (BAG) IMPLANT
SHEATH SET SUPER ARROWFLEX 8FR (SHEATH) IMPLANT
SHEATH SUPER FLEX 6FR 11 (SHEATH) IMPLANT
STENT SYNERGY XD 3.0X16 (Permanent Stent) IMPLANT
SYNERGY XD 3.0X16 (Permanent Stent) ×1 IMPLANT
WIRE AMPLATZ SS-J .035X180CM (WIRE) IMPLANT
WIRE AMPLATZ ST .035X145CM (WIRE) IMPLANT
WIRE ASAHI PROWATER 180CM (WIRE) IMPLANT
WIRE EMERALD 3MM-J .035X150CM (WIRE) IMPLANT

## 2023-10-13 NOTE — Consult Note (Signed)
[name], MRN:  [id number], DOB:  1965-12-14, LOS: 0 ADMISSION DATE:  10/13/2023, CONSULTATION DATE:  10/13/23 REFERRING MD:  EDP, CHIEF COMPLAINT:  chest pain   History of Present Illness:  57 year old man w/ hx of CAD s/p PCI in 2019 presenting with crushing chest pain.  EKG with STEMI.  Pain was a little abnormal in character so taken for dissection protocol CTA which was negative.  In CT Vfib arrest x 3 with ROSC, brought emergently to cath lab with LUCAS compressions ongoing.  PCCM consulted to assist with management.  Pertinent  Medical History  CAD w/ prior STEMI and PCI Ischemic cardiomyopathy EF 45-50% back in 2016  Significant Hospital Events: Including procedures, antibiotic start and stop dates in addition to other pertinent events   12/15 admit, code, ECMO  Interim History / Subjective:  Consult  Objective   Blood pressure 103/70, pulse (!) 111, temperature 97.9 F (36.6 C), temperature source Temporal, resp. rate (!) 35, height 5\' 1"  (1.549 m), weight 72.6 kg, SpO2 100%.       No intake or output data in the 24 hours ending 10/13/23 1194 Filed Weights   10/13/23 0900  Weight: 72.6 kg    Examination: General: grey appearing man getting compression HENT: ETT in place, trachea midline Lungs: rhonci bilaterally Cardiovascular: no pulses Abdomen: deferred Extremities: cool Neuro: was reaching for tube prior to sedation Skin: grey/ashen  Resolved Hospital Problem list   N/A  Assessment & Plan:  STEMI, Vfib arrest, cardiogenic shock- s/p eCPR 10/13/23 Presumed aspiration pneumonia Hx CAD with prior PCI Hx ischemic cardiomyopathy Acute kidney injury  - ECMO cannulation being done then PCI then ?venting depending on echo findings - Pressors for MAP 65 - R radial A-line; titrate sweep/oxygenator for paO2 100-200, pH 7.35-7.4 - Rest settings on vent, will adjust PRN - Bival for AC - Antiplatelets per cards/AHF - Versed/fent for sedation  Best  Practice (right click and "Reselect all SmartList Selections" daily)   Diet/type: NPO DVT prophylaxis other Pressure ulcer(s): N/A GI prophylaxis: PPI Lines: Central line Foley:  Yes, and it is still needed Code Status:  full code Last date of multidisciplinary goals of care discussion [pending]  Labs   CBC: Recent Labs  Lab 10/13/23 0816  HGB 13.6  12.9*  HCT 40.0  38.0*    Basic Metabolic Panel: Recent Labs  Lab 10/13/23 0816  NA 139  140  K 4.0  3.9  CL 107  GLUCOSE 190*  BUN 22*  CREATININE 1.90*   GFR: Estimated Creatinine Clearance: 37.1 mL/min (A) (by C-G formula based on SCr of 1.9 mg/dL (H)). Recent Labs  Lab 10/13/23 0817  LATICACIDVEN 3.2*    Liver Function Tests: No results for input(s): "AST", "ALT", "ALKPHOS", "BILITOT", "PROT", "ALBUMIN" in the last 168 hours. No results for input(s): "LIPASE", "AMYLASE" in the last 168 hours. No results for input(s): "AMMONIA" in the last 168 hours.  ABG    Component Value Date/Time   PHART 7.357 06/06/2018 0618   PCO2ART 35.6 06/06/2018 0618   PO2ART 116.0 (H) 06/06/2018 0618   HCO3 22.6 10/13/2023 0816   TCO2 24 10/13/2023 0816   TCO2 21 (L) 10/13/2023 0816   ACIDBASEDEF 1.0 10/13/2023 0816   O2SAT 30 10/13/2023 0816     Coagulation Profile: No results for input(s): "INR", "PROTIME" in the last 168 hours.  Cardiac Enzymes: No results for input(s): "CKTOTAL", "CKMB", "CKMBINDEX", "TROPONINI" in the last 168 hours.  HbA1C: Hgb A1c  MFr Bld  Date/Time Value Ref Range Status  06/07/2018 01:11 AM 5.8 (H) 4.8 - 5.6 % Final    Comment:    (NOTE) Pre diabetes:          5.7%-6.4% Diabetes:              >6.4% Glycemic control for   <7.0% adults with diabetes   08/31/2015 04:08 PM 5.2 4.8 - 5.6 % Final    Comment:    (NOTE)         Pre-diabetes: 5.7 - 6.4         Diabetes: >6.4         Glycemic control for adults with diabetes: <7.0     CBG: No results for input(s): "GLUCAP" in the last 168  hours.  Review of Systems:   Obtunded  Past Medical History:  He,  has a past medical history of CAD (coronary artery disease) (09/03/2015), Coronary artery disease, Former tobacco use, Hyperlipemia, Ischemic cardiomyopathy, and ST elevation myocardial infarction (STEMI) of inferior wall (HCC) (08/31/2015).   Surgical History:   Past Surgical History:  Procedure Laterality Date   CARDIAC CATHETERIZATION N/A 08/31/2015   Procedure: Left Heart Cath and Coronary Angiography;  Surgeon: Tonny Bollman, MD;  Location: [facility] INVASIVE CV LAB;  Service: Cardiovascular;  Laterality: N/A;   CARDIAC CATHETERIZATION N/A 08/31/2015   Procedure: Coronary Stent Intervention;  Surgeon: Tonny Bollman, MD;  Location: [facility] INVAS[facility] CV LAB;  Service: Cardiovascular;  Laterality: N/A;   CARDIAC CATHETERIZATION N/A 09/02/2015   Procedure: Coronary Stent Intervention;  Surgeon: Corky Crafts, MD;  Location: [facility] INVASIVE CV LAB;  Service: Cardiovascular;  Laterality: N/A;   CARDIAC CATHETERIZATION N/A 09/02/2015   Procedure: Left Heart Cath and Coronary Angiography;  Surgeon: Corky Crafts, MD;  [facility]VAS[facility] CV LAB;  Service: Cardiovascular;  Laterality: N/A;   CORONARY/GRAFT ACUTE MI REVASCULARIZATION N/A 06/06/2018   Procedure: Coronary/Graft Acute MI Revascularization;  Surgeon: Corky Crafts, MD;  Location: [facility] INVASIVE CV LAB;  Service: Cardiovascular;  Laterality: N/A;   LEFT HEART CATH AND CORONARY ANGIOGRAPHY N/A 06/06/2018   Procedure: LEFT HEART CATH AND CORONARY ANGIOGRAPHY;  Surgeon: Corky Crafts, MD;  Location: [facility] INVASIVE CV LAB;  Service: Cardiovascular;  Laterality: N/A;   NO PAST SURGERIES       Social History:   reports that he has quit smoking. He has never used smokeless tobacco. He reports that he does not drink alcohol and does not use drugs.   Family History:  His family history includes Other in his unknown relative.   Allergies Allergies  Allergen Reactions    Lisinopril Rash          Home Medications  Prior to Admission medications   Medication Sig Start Date End Date Taking? Authorizing Provider  atorvastatin (LIPITOR) 80 MG tablet Take 1 tablet (80 mg total) by mouth daily at 6 PM. 06/08/18 06/08/19  Tereso Newcomer T, PA-C  carvedilol (COREG) 3.125 MG tablet Take 1 tablet (3.125 mg total) by mouth 2 (two) times daily with a meal. 06/08/18 06/08/19  Weaver, Lorin Picket T, PA-C  nitroGLYCERIN (NITROSTAT) 0.4 MG SL tablet Place 1 tablet (0.4 mg total) under the tongue every 5 (five) minutes as needed for chest pain. 06/08/18 06/08/19  Tereso Newcomer T, PA-C     Critical care time: 32 mins independent of procedures

## 2023-10-13 NOTE — ED Provider Notes (Signed)
**Caleb Caleb** Caleb Caleb   CSN: [id number] Arrival date & time: 10/13/23  0759     History  Chief Complaint  Patient presents with   Resp Distress   Code STEMI    Caleb Caleb is a 57 y.o. male.  Patient is a 57 year old male with a past medical history of CAD, CHF presenting to the emergency department with chest pain and shortness of breath.  EMS states that they were called out this morning for shortness of breath.  On their arrival the patient was diaphoretic and clutching his chest and in respiratory distress.  He started on CPAP and route.  EKG in transport was concerning for STEMI and prehospital arrival STEMI alert was called.  The patient's wife states that he was feeling normal yesterday and woke up this morning complaining of chest pain with nausea but did not vomit.  The history is provided by the spouse and the EMS personnel. The history is limited by the condition of the patient and a language barrier. A language interpreter was used Engineer, materials).       Home Medications Prior to Admission medications   Medication Sig Start Date End Date Taking? Authorizing Provider  atorvastatin (LIPITOR) 80 MG tablet Take 1 tablet (80 mg total) by mouth daily at 6 PM. 06/08/18 06/08/19  Tereso Newcomer T, PA-C  carvedilol (COREG) 3.125 MG tablet Take 1 tablet (3.125 mg total) by mouth 2 (two) times daily with a meal. 06/08/18 06/08/19  Weaver, Lorin Picket T, PA-C  nitroGLYCERIN (NITROSTAT) 0.4 MG SL tablet Place 1 tablet (0.4 mg total) under the tongue every 5 (five) minutes as needed for chest pain. 06/08/18 06/08/19  Tereso Newcomer T, PA-C      Allergies    Lisinopril    Review of Systems   Review of Systems  Physical Exam Updated Vital Signs BP 103/70   Pulse (!) 111   Temp 97.9 F (36.6 C) (Temporal)   Resp (!) 35   Ht 5\' 1"  (1.549 m)   Wt 72.6 kg Comment: Estimated weight  SpO2 100%   BMI 30.23 kg/m  Physical Exam Vitals and nursing  Caleb reviewed.  Constitutional:      General: He is in acute distress.     Appearance: He is ill-appearing. He is not diaphoretic.  HENT:     Head: Normocephalic.     Nose: Nose normal.     Mouth/Throat:     Mouth: Mucous membranes are moist.  Eyes:     Extraocular Movements: Extraocular movements intact.     Conjunctiva/sclera: Conjunctivae normal.  Neck:     Comments: Significantly elevated JVD Cardiovascular:     Rate and Rhythm: Regular rhythm. Tachycardia present.     Heart sounds: Normal heart sounds.  Pulmonary:     Breath sounds: Rhonchi (bilateral bases) present.     Comments: Tachypneic but no retractions Abdominal:     General: Abdomen is flat.     Palpations: Abdomen is soft.  Musculoskeletal:        General: Normal range of motion.     Cervical back: Normal range of motion.     Right lower leg: No edema.     Left lower leg: No edema.  Skin:    General: Skin is dry.     Comments: Cool distal extremities  Neurological:     Mental Status: He is alert.     Comments: Oriented to person, limited exam secondary to distress but moving  all 4 extremities equally     ED Results / Procedures / Treatments   Labs (all labs ordered are listed, but only abnormal results are displayed) Labs Reviewed  I-STAT CG4 LACTIC ACID, ED - Abnormal; Notable for the following components:      Result Value   Lactic Acid, Venous 3.2 (*)    All other components within normal limits  I-STAT VENOUS BLOOD GAS, ED - Abnormal; Notable for the following components:   pCO2, Ven 35.0 (*)    pO2, Ven 19 (*)    Calcium, Ion 1.05 (*)    HCT 38.0 (*)    Hemoglobin 12.9 (*)    All other components within normal limits  I-STAT CHEM 8, ED - Abnormal; Notable for the following components:   BUN 22 (*)    Creatinine, Ser 1.90 (*)    Glucose, Bld 190 (*)    Calcium, Ion 1.04 (*)    TCO2 21 (*)    All other components within normal limits  TYPE AND SCREEN    EKG None  Radiology CT Angio  Chest Aorta W and/or Wo Contrast Result Date: 10/13/2023 CLINICAL DATA:  Chest pain, nonspecific, STEMI, coding on CT table EXAM: CT ANGIOGRAPHY CHEST WITH CONTRAST TECHNIQUE: Multidetector CT imaging of the chest was performed using the standard protocol during bolus administration of intravenous contrast. Multiplanar CT image reconstructions and MIPs were obtained to evaluate the vascular anatomy. RADIATION DOSE REDUCTION: This exam was performed according to the departmental dose-optimization program which includes automated exposure control, adjustment of the mA and/or kV according to patient size and/or use of iterative reconstruction technique. CONTRAST:  75mL OMNIPAQUE IOHEXOL 350 MG/ML SOLN COMPARISON:  08/31/2015 chest radiograph. FINDINGS: Cardiovascular: The study is moderate quality for the evaluation of pulmonary embolism, with some motion degradation. There are no compelling filling defects in the central, lobar, segmental or subsegmental pulmonary artery branches to suggest acute pulmonary embolism. Atherosclerotic nonaneurysmal thoracic aorta. Dilated main pulmonary artery (3.8 cm diameter). Mild-to-moderate cardiomegaly. No significant pericardial fluid/thickening. Three-vessel coronary atherosclerosis. Mediastinum/Nodes: No significant thyroid nodules. Unremarkable esophagus. No axillary adenopathy. Mild right paratracheal adenopathy up to 1.3 cm (series 6/image 30). Mildly enlarged 1.2 cm subcarinal node (series 6/image 51). No hilar adenopathy. Lungs/Pleura: No pneumothorax. Small posterior right pleural effusion. No left pleural effusion. Widespread patchy indistinct parahilar consolidation and ground-glass opacity throughout both lungs, most prominent in the right upper lobe. No discrete lung masses or significant pulmonary nodules. Upper abdomen: Simple 1.2 cm posterior upper left renal cyst, for which no follow-up imaging is recommended. Musculoskeletal: No aggressive appearing focal  osseous lesions. Moderate thoracic spondylosis. Review of the MIP images confirms the above findings. IMPRESSION: 1. No evidence of acute pulmonary embolism. 2. Spectrum of findings compatible with acute congestive heart failure. Mild-to-moderate cardiomegaly. Small right pleural effusion. Widespread patchy indistinct parahilar consolidation and ground-glass opacity throughout both lungs, most prominent in the right upper lobe, compatible with acute cardiogenic pulmonary edema. 3.  Three-vessel coronary atherosclerosis. 4. Dilated main pulmonary artery, suggesting pulmonary arterial hypertension. 5.  Aortic Atherosclerosis (ICD10-I70.0). Electronically Signed   By: Delbert Phenix M.D.   On: 10/13/2023 09:04    Procedures .Critical Care  Performed by: Rexford Maus, DO Authorized by: Rexford Maus, DO   Critical care provider statement:    Critical care time (minutes):  60   Critical care was necessary to treat or prevent imminent or life-threatening deterioration of the following conditions:  Circulatory failure and shock   Critical care  was time spent personally by me on the following activities:  Development of treatment plan with patient or surrogate, discussions with consultants, evaluation of patient's response to treatment, examination of patient, ordering and review of laboratory studies, ordering and review of radiographic studies, ordering and performing treatments and interventions, pulse oximetry, re-evaluation of patient's condition, review of old charts and obtaining history from patient or surrogate   I assumed direction of critical care for this patient from another provider in my specialty: no     Care discussed with: admitting provider   Procedure Name: Intubation Date/Time: 10/13/2023 9:20 AM  Performed by: Rexford Maus, DOPre-anesthesia Checklist: Patient identified, Patient being monitored, Emergency Drugs available, Timeout performed and Suction  available Oxygen Delivery Method: Non-rebreather mask Preoxygenation: Pre-oxygenation with 100% oxygen Induction Type: Rapid sequence Ventilation: Mask ventilation without difficulty Laryngoscope Size: Glidescope and 4 Grade View: Grade I Tube size: 7.5 mm Number of attempts: 1 Airway Equipment and Method: Video-laryngoscopy and Rigid stylet Placement Confirmation: ETT inserted through vocal cords under direct vision, CO2 detector and Breath sounds checked- equal and bilateral Secured at: 24 cm Tube secured with: ETT holder Dental Injury: Teeth and Oropharynx as per pre-operative assessment     Ultrasound ED Echo  Date/Time: 10/13/2023 9:21 AM  Performed by: Rexford Maus, DO Authorized by: Rexford Maus, DO   Procedure details:    Indications: chest pain     Views: parasternal long axis view, parasternal short axis view and IVC view     Images: not archived   Findings:    Pericardium: no pericardial effusion     LV Function: severly depressed (<30%)     RV Diameter: normal     IVC: dilated   Impression:    Impression: decreased contractility   Ultrasound ED Thoracic  Date/Time: 10/13/2023 9:21 AM  Performed by: Rexford Maus, DO Authorized by: Rexford Maus, DO   Procedure details:    Indications: dyspnea     Assessment for:  Interstitial syndrome   Left lung pleural:  Visualized   Right lung pleural:  Visualized   Images: not archived   Findings:    A-lines noted throughout: not identified     B-lines noted throughout: identified   Right Lung Findings:     right lung sliding Left Lung Findings:     left lung sliding Impression:    Impression: pulmonary edema       Medications Ordered in ED Medications  0.9 %  sodium chloride infusion (0 mLs Intravenous Hold 10/13/23 0821)  norepinephrine (LEVOPHED) 4mg  in (0.016 mg/mL) premix infusion ( Intravenous MAR Hold 10/13/23 0920)  fentaNYL 10 mcg/ml infusion (has no  administration in time range)  midazolam-sodium chloride 100-0.9 MG/100ML-% infusion (has no administration in time range)  calcium chloride 10 % injection (has no administration in time range)  albumin human 5 % solution (has no administration in time range)  EPINEPHrine NaCl 5-0.9 MG/250ML-% premix infusion (has no administration in time range)  milrinone (PRIMACOR) 20 MG/100 ML (0.2 mg/mL) infusion (has no administration in time range)  sodium bicarbonate 1 mEq/mL injection (has no administration in time range)  fentaNYL 10 mcg/ml infusion (has no administration in time range)  sodium bicarbonate 25 mEq (Impella PURGE) in dextrose 5 % 1000 mL bag (has no administration in time range)  fentaNYL (SUBLIMAZE) bolus via infusion 50 mcg (has no administration in time range)  docusate (COLACE) 50 MG/5ML liquid 100 mg (has no administration in time range)  polyethylene glycol (MIRALAX / GLYCOLAX) packet 17 g (has no administration in time range)  fentaNYL in NS (7mcg/ml) infusion-PREMIX (has no administration in time range)  fentaNYL (SUBLIMAZE) bolus via infusion 50-100 mcg (has no administration in time range)  midazolam (VERSED) 100 mg/100 mL (1 mg/mL) premix infusion (has no administration in time range)  midazolam (VERSED) bolus via infusion 0-5 mg (has no administration in time range)  EPINEPHrine (ADRENALIN) 1 MG/10ML injection (1 mg Intravenous Given 10/13/23 0924)  sodium bicarbonate injection (50 mEq Intravenous Given 10/13/23 0928)  iohexol (OMNIPAQUE) 350 MG/ML injection 75 mL (75 mLs Intravenous Contrast Given 10/13/23 0851)    ED Course/ Medical Decision Making/ A&P                                 Medical Decision Making This patient presents to the ED with chief complaint(s) of chest pain, respiratory distress with pertinent past medical history of CAD, CHF which further complicates the presenting complaint. The complaint involves an extensive differential diagnosis  and also carries with it a high risk of complications and morbidity.    The differential diagnosis includes ACS, arrhythmia, anemia, dehydration, electrolyte abnormality, cardiogenic shock, septic shock  Additional history obtained: Additional history obtained from family and EMS  Records reviewed previous admission documents  ED Course and Reassessment: On patient's arrival to the emergency department he was tachypneic though had no retractions and was hypotensive to the 80s.  He was transitioned off of CPAP and was satting well on room air.  Bedside ultrasound by myself was performed that showed a severely reduced EF, dilated IVC and B-lines concerning for possible cardiogenic shock and he was started on Levophed.  EKG on arrival did show ST depressions in V4 through V6 though no significant ST elevations.  Shortly after patient's arrival cardiology arrived at bedside.  No obvious STEMI on EKG however based on patient's story was concerning that the patient did need to go to Cath Lab.  History was somewhat limited due to his language barrier but once we are able to obtain an interpreter and his family was at bedside he was able to tell us that he was having chest pain radiating into his back and in the setting of the hypotension was recommended by cardiology for CT dissection study first to evaluate for dissection prior to going to Cath Lab.  While patient was in the CT scanner he became apneic and was starting to be bagged and shortly after lost pulses and CPR was initiated.  He was initially in V. tach and required shock x 2 and was given 300 mg of amnio as well as 3 doses of epi before obtaining ROSC.  The patient was transported back to the room and cardiology was updated.  Cardiology activated ECMO team.  The patient was intubated in the room and briefly lost pulses again after 1 round of epi he regained ROSC.  He went again into V. tach and was shocked 1 more time and then was transported to [facility].  Patient's family was updated by myself and brought to Cath Lab waiting room by chaplain.  Independent labs interpretation:  The following labs were independently interpreted: elevated lactic, AKI  Independent visualization of imaging: - I independently visualized the following imaging with scope of interpretation limited to determining acute life threatening conditions related to emergency care: CT dissection study, which revealed no obvious dissection or PE,  signs of CHF  Consultation: - Consulted or discussed management/test interpretation w/ external professional: cardiology, ECMO team  Consideration for admission or further workup: patient requires admission for suspected cardiogenic shock Social Determinants of health: N/A    Amount and/or Complexity of Data Reviewed Radiology: ordered.  Risk Prescription drug management.          Final Clinical Impression(s) / ED Diagnoses Final diagnoses:  Cardiogenic shock [facility])  Cardiac arrest with ventricular fibrillation [facility])  ACS (acute coronary syndrome) [facility])    Rx / DC Orders ED Discharge Orders     None         Rexford Maus, DO 10/13/23 [phone]

## 2023-10-13 NOTE — Consult Note (Signed)
Renal Service Consult Note [facility]  Caleb [name] 10/13/2023 Maree Krabbe, MD Requesting Physician: Dr. Katrinka Blazing, D.   Reason for Consult: Renal failure, metabolic acidosis HPI: The patient is a 57 y.o. year-old w/ PMH as below who presented 12/15 early today w/ chest pains. STEMI was called. Had LAD stent placed, also Impella placement and ECMO cannulation. Renal function worsening here and need CRRT for treatment of the metabolic acidosis.  We are asked to see for CRRT.    Pt seen in ICU. Pt sedated on vent. No hx obtained.    ROS - n/a  Past Medical History  Past Medical History:  Diagnosis Date   CAD (coronary artery disease) 09/03/2015   Inf STEMI 2016 tx with DES to RCA and staged PCI with DES x 2 to LAD // Inf STEMI 8/19 - 2/2 stent thrombosis - LHC:  LAD stents patent, OM1 90, dRCA 100 >> PCI:  Thrombectomy and DES to dRCA   Coronary artery disease    a. Inf STEMI 08/2015 s/p DES to RCA then staged DESx2 to prox and distal LAD;  residual 90% small OM1 which is being treated medically.   Former tobacco use    Hyperlipemia    Ischemic cardiomyopathy    a. 2D echo 09/02/15: mild LVH, EF 45-50%, mild HK of inferolateral myocardium, grade 1 DD, mild AI/MR.   ST elevation myocardial infarction (STEMI) of inferior wall (HCC) 08/31/2015   Past Surgical History  Past Surgical History:  Procedure Laterality Date   CARDIAC CATHETERIZATION N/A 08/31/2015   Procedure: Left Heart Cath and Coronary Angiography;  Surgeon: Tonny Bollman, MD;  Location: [facility] INVASIVE CV LAB;  Service: Cardiovascular;  Laterality: N/A;   CARDIAC CATHETERIZATION N/A 08/31/2015   Procedure: Coronary Stent Intervention;  Surgeon: Tonny Bollman, MD;  Location: [facility] INVASIVE CV LAB;  Service: Cardiovascular;  Laterality: N/A;   CARDIAC CATHETERIZATION N/A 09/02/2015   Procedure: Coronary Stent Intervention;  Surgeon: Corky Crafts, MD;  Location: [facility] INVASIVE CV LAB;  Service: Cardiovascular;   Laterality: N/A;   CARDIAC CATHETERIZATION N/A 09/02/2015   Procedure: Left Heart Cath and Coronary Angiography;  Surgeon: Corky Crafts, MD;  Location: [facility] INVASIVE CV LAB;  Service: Cardiovascular;  Laterality: N/A;   CORONARY/GRAFT ACUTE MI REVASCULARIZATION N/A 06/06/2018   Procedure: Coronary/Graft Acute MI Revascularization;  Surgeon: Corky Crafts, MD;  Location: [facility] INVASIVE CV LAB;  Service: Cardiovascular;  Laterality: N/A;   LEFT HEART CATH AND CORONARY ANGIOGRAPHY N/A 06/06/2018   Procedure: LEFT HEART CATH AND CORONARY ANGIOGRAPHY;  Surgeon: Corky Crafts, MD;  Location: [facility] INVASIVE CV LAB;  Service: Cardiovascular;  Laterality: N/A;   NO PAST SURGERIES     Family History  Family History  Problem Relation Age of Onset   Other Unknown        Patient does not know if any hx of CAD   Social History  reports that he has quit smoking. He has never used smokeless tobacco. He reports that he does not drink alcohol and does not use drugs. Allergies  Allergies  Allergen Reactions   Lisinopril Rash        Home medications Prior to Admission medications   Medication Sig Start Date End Date Taking? Authorizing Provider  atorvastatin (LIPITOR) 80 MG tablet Take 1 tablet (80 mg total) by mouth daily at 6 PM. 06/08/18 06/08/19  Weaver, Lorin Picket T, PA-C  carvedilol (COREG) 3.125 MG tablet Take 1 tablet (3.125 mg total) by mouth 2 (two)  times daily with a meal. 06/08/18 06/08/19  Weaver, Lorin Picket T, PA-C  nitroGLYCERIN (NITROSTAT) 0.4 MG SL tablet Place 1 tablet (0.4 mg total) under the tongue every 5 (five) minutes as needed for chest pain. 06/08/18 06/08/19  Tereso Newcomer T, PA-C     Vitals:   10/13/23 1430 10/13/23 1445 10/13/23 1500 10/13/23 1513  BP:      Pulse:    80  Resp: (!) 0 10 19   Temp: (!) 96.1 F (35.6 C) (!) 96.4 F (35.8 C) (!) 96.4 F (35.8 C)   TempSrc:      SpO2:    100%  Weight:      Height:       Exam Gen on vent, sedated No rash, cyanosis or  gangrene Sclera anicteric, throat w/ ETT No jvd or bruits Chest clear anterior/ lateral RRR no MRG Abd soft ntnd no mass or ascites +bs GU nl male MS no joint effusions or deformity Ext no sig LE edema, no wounds or ulcers Neuro is on vent, sedated    Renal-related home meds: - coreg 3.125 bid, sl ntg, lipitor     Date   Creat   eGFR   10/28/2009 1.21 Sep 2015 0.80- 0.96   Aug 2019 1.40 >> 1.05  AKI episode   10/13/23 1.90, 1.90, 2.83 25 ml/min        Na 142   K 3.1  CO2 17  AG 26  BUN 19  creat 2.83  alb 1.5  tpro 3.8    AST 616, ALO 135  LDH 1317    WBC 18K  hb  9.9  plt 274    Assessment/ Plan: AKI - suspect creat is normal at baseline. Was 1.05 in Aug 2019. Creat here was 1.9 on admit in setting of acute MI w/ cardiogenic shock. Went up to 2.8 today. CRRT requested by CCM for acidosis control. Plan is for CRRT.  Acute MI w/ cardiogenic shock - on Impella and ECMO, per CHF team.  Shock - on levo, epi gtts Volume - a bit edematous on exam, otherwise not sure vol status. May have CVP's available.  Anemia of eskd - Hb 9.5- 10. No esa needs.  MBD ckd - CCa in range, add on phos. Hold po/ ng meds for now.       Vinson Moselle  MD [facility] 10/13/2023, 3:29 PM  Recent Labs  Lab 10/13/23 0953 10/13/23 1010 10/13/23 1035 10/13/23 1210 10/13/23 1338 10/13/23 1415  HGB 10.5*   < > 9.2* 10.0*  --  9.9*  ALBUMIN  --   --   --   --  1.5*  --   CALCIUM  --   --   --   --  6.9*  --   CREATININE 1.90*  --   --   --  2.83*  --   K 2.8*   < > 2.5*  --  3.1*  --    < > = values in this interval not displayed.   Inpatient medications:  [START ON 10/14/2023] aspirin  81 mg Per Tube Daily   docusate  100 mg Per Tube BID   fentaNYL  50 mcg Intravenous Once   pantoprazole (PROTONIX) IV  40 mg Intravenous QHS   polyethylene glycol  17 g Per Tube Daily   sodium bicarbonate       sodium chloride flush  10 mL Intravenous Q12H   sodium chloride flush  3 mL Intravenous Q12H  ticagrelor   90 mg Per Tube BID   vasopressin        sodium chloride Stopped (10/13/23 0821)   sodium chloride 250 mL (10/13/23 1524)   amiodarone 60 mg/hr (10/13/23 1302)   dextrose 5 % and 0.45 % NaCl     epinephrine 15 mcg/min (10/13/23 1357)   fentaNYL     fentaNYL     fentaNYL infusion INTRAVENOUS 100 mcg/hr (10/13/23 1302)   insulin 4 Units/hr (10/13/23 1452)   lidocaine 1 mg/min (10/13/23 1301)   magnesium sulfate     magnesium sulfate     meropenem (MERREM) IV 1 g (10/13/23 1526)   midazolam 2 mg/hr (10/13/23 1430)   norepinephrine (LEVOPHED) Adult infusion     potassium chloride     potassium chloride     potassium chloride     sodium bicarbonate 150 mEq in dextrose 5 % 1,150 mL infusion 100 mL/hr at 10/13/23 1511   sodium bicarbonate 25 mEq (Impella PURGE) in dextrose 5 % 1000 mL bag 200 mL/hr at 10/13/23 1046   vancomycin     Followed by   vancomycin     vasopressin     sodium chloride, dextrose, fentaNYL, fentaNYL, fentaNYL, heparin, magnesium sulfate, magnesium sulfate, midazolam, potassium chloride, potassium chloride, sodium bicarbonate, sodium chloride flush, vasopressin

## 2023-10-13 NOTE — Progress Notes (Signed)
Pt transported from CVL1 to 2H without incident. Flowing 3.8Lpm on 2L of O2. Pressure and rhythm stable.

## 2023-10-13 NOTE — ED Notes (Signed)
Family at bedside reports with interpreter pt felt like "throwing up" but never did. Pt did tell wife this morning he was having Lt sided CP. Pt actively having CP at this time, interpreter & family at beside reports that's what pt is saying while moaning.

## 2023-10-13 NOTE — Progress Notes (Signed)
RT assisted with transport of this pt from ED to cath lab while assisting ventilation with ambubag. Once in cath lab RT placed pt on full ventilatory support.

## 2023-10-13 NOTE — Progress Notes (Signed)
ECMO NOTE:   Indication: Cardiogenic shock/refractory VF   Initial cannulation date: 10/13/23   ECMO type: [facility] ECMO   Dual lumen inflow/return cannula:   1) 25 FR RFV multi-stage venous drainage 2) 19 FR L CFA arterial return 3) R& L SFA distal perfusion catheters 4) R CFA Impella CP vent  Daily data:   VA ECMO  Flow 4.0L RPM 3565 Sweep  4L  Impella CP P-2 with 1.3L flow Waveforms ok Position confirmed under echo   Labs:   ABG    Component Value Date/Time   PHART 7.232 (L) 10/13/2023 1035   PCO2ART 62.4 (H) 10/13/2023 1035   PO2ART 633 (H) 10/13/2023 1035   HCO3 26.2 10/13/2023 1035   TCO2 28 10/13/2023 1035   ACIDBASEDEF 2.0 10/13/2023 1035   O2SAT 100 10/13/2023 1035    Plan:  - Continue ECMO support with Impella CP vent - Hold AC with groin hematoma - Wean inotropes/pressors as able   D/w CCM at bedside  Arvilla Meres, MD  3:30 PM

## 2023-10-13 NOTE — ED Notes (Addendum)
Cardiologist assisted to get pt up stairs & we arrived to cath lab at 0909, zole, ED monitor & LUCAS still on but not compressing, RT bagging ET tube, pt was given RSI meds by cath lab staff & propofol was started at 0911 as he was being moved to the table.

## 2023-10-13 NOTE — ED Notes (Signed)
Zole pads applied to pt.

## 2023-10-13 NOTE — ED Notes (Signed)
Intubated at 0857 during CODE, ET is 24 at the lip, 7.5 tube.

## 2023-10-13 NOTE — Progress Notes (Addendum)
Drs. Gala Romney and Katrinka Blazing notified of all critical values received during shift within an hour of receiving them per policy. See orders.

## 2023-10-13 NOTE — CV Procedure (Addendum)
CV Procedure Note  Procedures performed:  1) Peripheral VA ECMO cannulation 2) Coronary angiography PCI/DES placement to the LAD (see Dr. Gibson Ramp note) 3) Impella CP placement 4) Pulmonary artery catheter placement 5) R SFA distal perfusion catheter placement 6) L SFA distal perfusion catheter placement 7) L FV 8FR venous sheath placement 8) L FV non-tunneled trialysis catheter 9) R radial arterial catheter  Indication:  1) Acute MI with cardiogenic shock and VF arrest  Operators:  1) Arvilla Meres, MD (primary) 2) Verne Carrow (co-primary)  57 y/o male with known CAD presented to ER with chest and abdominal pain. Chest CT negative for dissection. Developed VF arrest x 2 with ongoing CPR in ER.   On my arrival, patient with ROSC. Transported emergently to cath lab. Once on cath lab table patient arrested again and had more CPR and defibrillation. Decision made for emergent VA ECMO.   Groins prepped. With u/s guidance, a 7 FR sheath was placed in the RFV and 5 FR sheath placed in the L CFA. The 7FR venous sheath was removed over a superstiff 0.035" J wire with the distal end of the wire parked in the SVC. Serial dilations were made and a 4F multi-stage venous cannula was positioned in the in infra-hepatic IVC. The dilator and wire were removed and the cannula was clamped. The 5FR catheter in the L CFA was then removed over the wire and the tract serially dilated. A 19FR arterial return cannula was placed in the L CFA. The dilator and wire were removed and the cannula was clamped. Systemic heparin was given.   The ECMO tubing was brought onto the field. The lines were de-aired and wet-to-wet connections were made. ECMO flow was established.   Dr. Clifton James then proceeded with coronary angiography and PCI/DES to the acutely occluded LAD (culprit) - see separate procedure note.   Post PCI, the patient developed refractory VF. Flows on ECMO were optimized. He received over  20 shocks with repeated boluses of epi, bicarb, lidocaine, amiodarone, magnesium and potassium. During this time, flow dropped on ECMO with chugging. An 8FR venous sheath ws emergently placed in the LFV and the patient was aggressively volume resuscitated through all available ports. After approximately 30 minutes we were able to stabilize his rhythm.   Antegrade 33F distal perfusion catheters were placed in both the R and L SFAs using u/s guidance, a micropuncture kit and a straight Amplatz wire. The ECMO circuit was clamped transiently and both distal perfusion catheters were attached to the arterial return cannula via a Y-connector with good flow.   The pre-existing 6 FR sheath in the R CFA was exchanged for a 14 FR Impella sheath. A pigtail was placed in the LV using an 0.035" J wire. The J wire was removed and the Impella wire was placed wrapping the LV apex. LVEDP was 31. The Impella CP device was placed under fluoro guidance and flow was begun. The Impella peel-away sheath was removed and the repositioning sheath was sewn into place. Additional systemic heparin was given.  The 8 FR venous sheath in the LFV was then exchanged over a wire for a 13 FR trialysis cateter. The catheter was flushed and the side ports loaded with heparin.   The right neck was prepped and draped and a 9FR venous sheath was placed in the RIJ under u/s guidance. A VIP PA catheter was positioned with the tip in the right pulmonary artery under fluoro guidance.   The right wrist was prepped and  draped and a radial arterial line was placed in the right radial artery using u/s guidance and a modified Seldinger technique.   All sites were sutured and dressed and patient transferred to 2H in critical but stable condition.   Arvilla Meres, MD  1:10 PM

## 2023-10-13 NOTE — H&P (Signed)
Advanced Heart Failure Team History and Physical Note   PCP:  Patient, No Pcp Per  PCP-Cardiology: Tonny Bollman, MD     Reason for Admission: Cardiogenic shock   HPI:     57 y/o [place] male with h/o CAD with CAD admitted with acute MI, refractory VT/VF and cardiogenic shock.   Had inferior STEMI in 11/16 with DES to RCA. Two days later, he then underwent staged PCI with DES to the proximal and distal LAD for 90% and 70% stenosis respectively. Echo thereafter was notable for EF 45-50%, grade 1 diastolic dysfunction, mild LVH.   Lost to f/u until 8/19 when he presented with inferior STEMI. Cath showed very late stent thrombosis of the distal RCA stent.  Distal RCA was stented again after thrombectomy.     Again lost to f/u. Presented to ER today with acute chest and abdominal pain. CT chest negative.   Had recurrent VT/VF arrest and taken emergently to cath lab where he arrested again. Placed on VA ECMO with Impella vent. Cath with acute 100% occlusion of LAD and CTO of RCA. Underwent PCI/DES of LAD. Post PCI had prolinged 30 mins VF arrest and supported on ECMO. Eventually stabilized.   Once in 2H developed L groin bleed with near loss of low on ECMO. I held pressure for 1 hour and placed fem stop. Given IVF, albumin and 1u RBC. I repositioned Impella under u/s guidance due to suction events   Home Medications Prior to Admission medications   Medication Sig Start Date End Date Taking? Authorizing Provider  atorvastatin (LIPITOR) 80 MG tablet Take 1 tablet (80 mg total) by mouth daily at 6 PM. 06/08/18 06/08/19  Tereso Newcomer T, PA-C  carvedilol (COREG) 3.125 MG tablet Take 1 tablet (3.125 mg total) by mouth 2 (two) times daily with a meal. 06/08/18 06/08/19  Weaver, Lorin Picket T, PA-C  nitroGLYCERIN (NITROSTAT) 0.4 MG SL tablet Place 1 tablet (0.4 mg total) under the tongue every 5 (five) minutes as needed for chest pain. 06/08/18 06/08/19  Beatrice Lecher, PA-C    Past Medical  History: Past Medical History:  Diagnosis Date   CAD (coronary artery disease) 09/03/2015   Inf STEMI 2016 tx with DES to RCA and staged PCI with DES x 2 to LAD // Inf STEMI 8/19 - 2/2 stent thrombosis - LHC:  LAD stents patent, OM1 90, dRCA 100 >> PCI:  Thrombectomy and DES to dRCA   Coronary artery disease    a. Inf STEMI 08/2015 s/p DES to RCA then staged DESx2 to prox and distal LAD;  residual 90% small OM1 which is being treated medically.   Former tobacco use    Hyperlipemia    Ischemic cardiomyopathy    a. 2D echo 09/02/15: mild LVH, EF 45-50%, mild HK of inferolateral myocardium, grade 1 DD, mild AI/MR.   ST elevation myocardial infarction (STEMI) of inferior wall (HCC) 08/31/2015    Past Surgical History: Past Surgical History:  Procedure Laterality Date   CARDIAC CATHETERIZATION N/A 08/31/2015   Procedure: Left Heart Cath and Coronary Angiography;  Surgeon: Tonny Bollman, MD;  Location: [facility] INVASIVE CV LAB;  Service: Cardiovascular;  Laterality: N/A;   CARDIAC CATHETERIZATION N/A 08/31/2015   Procedure: Coronary Stent Intervention;  Surgeon: Tonny Bollman, MD;  Location: [facility] INVASIVE CV LAB;  Service: Cardiovascular;  Laterality: N/A;   CARDIAC CATHETERIZATION N/A 09/02/2015   Procedure: Coronary Stent Intervention;  Surgeon: Corky Crafts, MD;  Location: [facility] INVASIVE CV LAB;  Service: Cardiovascular;  Laterality: N/A;   CARDIAC CATHETERIZATION N/A 09/02/2015   Procedure: Left Heart Cath and Coronary Angiography;  Surgeon: Corky Crafts, MD;  Location: [facility] INVASIVE CV LAB;  Service: Cardiovascular;  Laterality: N/A;   CORONARY/GRAFT ACUTE MI REVASCULARIZATION N/A 06/06/2018   Procedure: Coronary/Graft Acute MI Revascularization;  Surgeon: Corky Crafts, MD;  Location: [facility] INVASIVE CV LAB;  Service: Cardiovascular;  Laterality: N/A;   LEFT HEART CATH AND CORONARY ANGIOGRAPHY N/A 06/06/2018   Procedure: LEFT HEART CATH AND CORONARY ANGIOGRAPHY;  Surgeon: Corky Crafts,  MD;  Location: [facility] INVASIVE CV LAB;  Service: Cardiovascular;  Laterality: N/A;   NO PAST SURGERIES      Family History:  Family History  Problem Relation Age of Onset   Other Unknown        Patient does not know if any hx of CAD    Social History: Social History   Socioeconomic History   Marital status: Single    Spouse name: Not on file   Number of children: Not on file   Years of education: Not on file   Highest education level: Not on file  Occupational History   Not on file  Tobacco Use   Smoking status: Former   Smokeless tobacco: Never  Vaping Use   Vaping status: Never Used  Substance and Sexual Activity   Alcohol use: No    Alcohol/week: 0.0 standard drinks of alcohol   Drug use: No   Sexual activity: Not on file  Other Topics Concern   Not on file  Social History Narrative   Not on file   Social Drivers of Health   Financial Resource Strain: Not on file  Food Insecurity: Not on file  Transportation Needs: Not on file  Physical Activity: Not on file  Stress: Not on file  Social Connections: Not on file    Allergies:  Allergies  Allergen Reactions   Lisinopril Rash         Objective:    Vital Signs:   Temp:  [96.1 F (35.6 C)-97.9 F (36.6 C)] 96.4 F (35.8 C) (12/15 1500) Pulse Rate:  [96-115] 111 (12/15 0826) Resp:  [0-39] 19 (12/15 1500) BP: (65-103)/(48-70) 103/70 (12/15 0826) SpO2:  [100 %] 100 % (12/15 0826) FiO2 (%):  [100 %] 100 % (12/15 1155) Weight:  [72.6 kg] 72.6 kg (12/15 0900)   Filed Weights   10/13/23 0900  Weight: 72.6 kg     Physical Exam     General:  Sedated on vent HEENT: +ETT Neck: Supple. RIJ swan, LIJ TLC Cor: Regular rate & rhythm. No rubs, gallops or murmurs. Lungs: + crakcle Abdomen: Soft, nontender, nondistended. Extremities: No cyanosis, clubbing, rash, 1+ edema  L groin hematoma. + Impella CP RFA + ECMO cannulas  Neuro: Intubated/sedated   Telemetry   Sinus tach    Labs     Basic  Metabolic Panel: Recent Labs  Lab 10/13/23 0816 10/13/23 0953 10/13/23 1010 10/13/23 1035 10/13/23 1338  NA 139  140 141 128* 145 142  K 4.0  3.9 2.8* 2.3* 2.5* 3.1*  CL 107 102  --   --  99  CO2  --   --   --   --  17*  GLUCOSE 190* 315*  --   --  539*  BUN 22* 20  --   --  19  CREATININE 1.90* 1.90*  --   --  2.83*  CALCIUM  --   --   --   --  6.9*    Liver Function Tests: Recent Labs  Lab 10/13/23 1338  AST 616*  ALT 135*  ALKPHOS 73  BILITOT 1.1  PROT 3.8*  ALBUMIN 1.5*   No results for input(s): "LIPASE", "AMYLASE" in the last 168 hours. No results for input(s): "AMMONIA" in the last 168 hours.  CBC: Recent Labs  Lab 10/13/23 0953 10/13/23 1010 10/13/23 1035 10/13/23 1210 10/13/23 1415  WBC  --   --   --  18.2* 18.5*  HGB 10.5* 11.9* 9.2* 10.0* 9.9*  HCT 31.0* 35.0* 27.0* 32.3* 32.2*  MCV  --   --   --  85.0 85.4  PLT  --   --   --  272 274    Cardiac Enzymes: No results for input(s): "CKTOTAL", "CKMB", "CKMBINDEX", "TROPONINI" in the last 168 hours.  BNP: BNP (last 3 results) No results for input(s): "BNP" in the last 8760 hours.  ProBNP (last 3 results) No results for input(s): "PROBNP" in the last 8760 hours.   CBG: No results for input(s): "GLUCAP" in the last 168 hours.  Coagulation Studies: Recent Labs    10/13/23 1210  LABPROT 20.9*  INR 1.8*    Imaging: DG Chest Port 1 View Result Date: 10/13/2023 CLINICAL DATA:  ST elevated myocardial infarction. EXAM: PORTABLE CHEST 1 VIEW COMPARISON:  AP chest 08/31/2015, CTA chest 10/13/2023 FINDINGS: New endotracheal tube tip terminates approximately joint 2.7 cm above the carina. New left subclavian approach central venous catheter tip overlies the superior vena cava/right atrial junction. New right internal jugular central venous catheter sheath with indwelling pulmonary arterial catheter overlying a branch of the right main pulmonary outflow tract. New enteric tube side port overlies the  stomach with the distal tip excluded by collimation. An apparent Impella device from distal approach with radiopaque blood inlet region overlying the right ventricle and the radiopaque blood outlet region overlying the proximal aortic arch. Cardiac silhouette is again mildly enlarged. Mediastinal contours are within normal limits. Diffuse heterogeneous airspace opacification throughout the right-greater-than-left lungs appears mildly worsened from CT earlier this morning. No definite pleural effusion. No pneumothorax. Mild multilevel degenerative disc changes of the thoracic spine. IMPRESSION: 1. New endotracheal tube tip terminates approximately 2.7 cm above the carina. 2. New left subclavian approach central venous catheter tip overlies the superior vena cava/right atrial junction. 3. New right internal jugular central venous catheter sheath with indwelling pulmonary arterial catheter overlying a branch of the right main pulmonary outflow tract. 4. New enteric tube side port overlies the stomach with the distal tip excluded by collimation. 5. Diffuse heterogeneous airspace opacification throughout the right-greater-than-left lungs appears mildly worsened from CT earlier this morning. This is again suggestive of worsening alveolar pulmonary edema. Electronically Signed   By: Neita Garnet M.D.   On: 10/13/2023 14:19   CARDIAC CATHETERIZATION Result Date: 10/13/2023   Ost 1st Mrg to 1st Mrg lesion is 90% stenosed.   Mid RCA to Dist RCA lesion is 100% stenosed.   Prox RCA to Mid RCA lesion is 100% stenosed.   Prox LAD to Mid LAD lesion is 50% stenosed.   Dist LAD lesion is 60% stenosed.   Mid LAD to Dist LAD lesion is 60% stenosed.   Prox LAD lesion is 100% stenosed.   A drug-eluting stent was successfully placed using a SYNERGY XD 3.0X16.   Post intervention, there is a 0% residual stenosis.   Post intervention, there is a 0% residual stenosis. Acute anterior STEMI Cardiogenic shock Placement of ECMO for  support  Thrombotic occlusion of the mid LAD before the old stent. Successful PTCA/DES x 1 mid LAD Small Circumflex with chronic severe stenosis in the small obtuse marginal branch. Large dominant RCA with chronic mid occlusion. The distal vessel fills from left to right collaterals post reperfusion of the LAD. Recommendations: He  was loaded with Brilinta 180 mg in the cath lab. He was given ASA 324 mg x 1. He will be placed on Angiomax drip per the CHF team with ECMO in place. Will plan to continue DAPT with ASA and Brilinta for one year. High intensity statin. Management of cardiogenic shock/ECMO per Advanced Heart Failure team.   CT Angio Chest Aorta W and/or Wo Contrast Result Date: 10/13/2023 CLINICAL DATA:  Chest pain, nonspecific, STEMI, coding on CT table EXAM: CT ANGIOGRAPHY CHEST WITH CONTRAST TECHNIQUE: Multidetector CT imaging of the chest was performed using the standard protocol during bolus administration of intravenous contrast. Multiplanar CT image reconstructions and MIPs were obtained to evaluate the vascular anatomy. RADIATION DOSE REDUCTION: This exam was performed according to the departmental dose-optimization program which includes automated exposure control, adjustment of the mA and/or kV according to patient size and/or use of iterative reconstruction technique. CONTRAST:  75mL OMNIPAQUE IOHEXOL 350 MG/ML SOLN COMPARISON:  08/31/2015 chest radiograph. FINDINGS: Cardiovascular: The study is moderate quality for the evaluation of pulmonary embolism, with some motion degradation. There are no compelling filling defects in the central, lobar, segmental or subsegmental pulmonary artery branches to suggest acute pulmonary embolism. Atherosclerotic nonaneurysmal thoracic aorta. Dilated main pulmonary artery (3.8 cm diameter). Mild-to-moderate cardiomegaly. No significant pericardial fluid/thickening. Three-vessel coronary atherosclerosis. Mediastinum/Nodes: No significant thyroid nodules. Unremarkable  esophagus. No axillary adenopathy. Mild right paratracheal adenopathy up to 1.3 cm (series 6/image 30). Mildly enlarged 1.2 cm subcarinal node (series 6/image 51). No hilar adenopathy. Lungs/Pleura: No pneumothorax. Small posterior right pleural effusion. No left pleural effusion. Widespread patchy indistinct parahilar consolidation and ground-glass opacity throughout both lungs, most prominent in the right upper lobe. No discrete lung masses or significant pulmonary nodules. Upper abdomen: Simple 1.2 cm posterior upper left renal cyst, for which no follow-up imaging is recommended. Musculoskeletal: No aggressive appearing focal osseous lesions. Moderate thoracic spondylosis. Review of the MIP images confirms the above findings. IMPRESSION: 1. No evidence of acute pulmonary embolism. 2. Spectrum of findings compatible with acute congestive heart failure. Mild-to-moderate cardiomegaly. Small right pleural effusion. Widespread patchy indistinct parahilar consolidation and ground-glass opacity throughout both lungs, most prominent in the right upper lobe, compatible with acute cardiogenic pulmonary edema. 3.  Three-vessel coronary atherosclerosis. 4. Dilated main pulmonary artery, suggesting pulmonary arterial hypertension. 5.  Aortic Atherosclerosis (ICD10-I70.0). Electronically Signed   By: Delbert Phenix M.D.   On: 10/13/2023 09:04     Assessment/Plan   1. Refractory VT/VF arrest in setting of acute MI 12/15 - defibrillated > 30 times - continue amio/lido for now.  - continue ECMO support - Keep K> 4 MG > 2  2. Acute systolic HF with Cardiogenic shock, SCAI E - ECPR with VA ECMO cannulation 12/15 - Impella vent - Continue epi, NE. Wean as tolerated - Bedside echo 12/14 EF 10% RV severely HK - see ECMO circuit note  3. CAD with acute anterior MI (NSTEMI) - h/o LAD and RCA stents - s/p PCI/DES to occluded LAD on 12/15. RCA CTO - Continue DAPT/statin  4. Acute hypoxic respiratory failure - due to  cardiac arrest/pulmonary edema - on vent and VA ECMO - CCM co-managing  5. Acute blood  loss anemia with L groin hematoma - pressure held. Fem stop in place - transfuse to keep Hgb > 8 g/dl  6. AKI - due to ATN - HD cath in place - Nephrology consulted  7. Shock liver - supportive care  8. DM2 - insulin gtt  9. Noncompliance - has not followed up after previous MIs  Prognosis very tenuous. Critical care time before and after ECMO cannulation = 3 hours   Arvilla Meres, MD 10/13/2023, 3:08 PM  Advanced Heart Failure Team Pager [phone] (M-F; 7a - 5p)  Please contact [facility] for night-coverage after hours (4p -7a ) and weekends on [email or web address]

## 2023-10-13 NOTE — Progress Notes (Signed)
PHARMACY - ANTICOAGULATION CONSULT NOTE  Pharmacy Consult for bivalirudin  Indication:  ECMO/Impella CP  Allergies  Allergen Reactions   Lisinopril Rash         Patient Measurements: Height: 5\' 1"  (154.9 cm) Weight: 72.6 kg (160 lb) (Estimated weight) IBW/kg (Calculated) : 52.3 Heparin Dosing Weight: 67.5 kg  Vital Signs: Temp: 97.9 F (36.6 C) (12/15 0805) Temp Source: Temporal (12/15 0805) BP: 103/70 (12/15 0826) Pulse Rate: 111 (12/15 0826)  Labs: Recent Labs    10/13/23 0816 10/13/23 0953 10/13/23 1010 10/13/23 1035 10/13/23 1210 10/13/23 1338 10/13/23 1415  HGB 13.6  12.9* 10.5*   < > 9.2* 10.0*  --  9.9*  HCT 40.0  38.0* 31.0*   < > 27.0* 32.3*  --  32.2*  PLT  --   --   --   --  272  --  274  APTT  --   --   --   --  >200*  --   --   LABPROT  --   --   --   --  20.9*  --   --   INR  --   --   --   --  1.8*  --   --   CREATININE 1.90* 1.90*  --   --   --  2.83*  --    < > = values in this interval not displayed.    Estimated Creatinine Clearance: 24.9 mL/min (A) (by C-G formula based on SCr of 2.83 mg/dL (H)).   Medical History: Past Medical History:  Diagnosis Date   CAD (coronary artery disease) 09/03/2015   Inf STEMI 2016 tx with DES to RCA and staged PCI with DES x 2 to LAD // Inf STEMI 8/19 - 2/2 stent thrombosis - LHC:  LAD stents patent, OM1 90, dRCA 100 >> PCI:  Thrombectomy and DES to dRCA   Coronary artery disease    a. Inf STEMI 08/2015 s/p DES to RCA then staged DESx2 to prox and distal LAD;  residual 90% small OM1 which is being treated medically.   Former tobacco use    Hyperlipemia    Ischemic cardiomyopathy    a. 2D echo 09/02/15: mild LVH, EF 45-50%, mild HK of inferolateral myocardium, grade 1 DD, mild AI/MR.   ST elevation myocardial infarction (STEMI) of inferior wall (HCC) 08/31/2015    Medications:  Scheduled:   [START ON 10/14/2023] aspirin  81 mg Per Tube Daily   docusate  100 mg Per Tube BID   fentaNYL  50 mcg Intravenous  Once   pantoprazole (PROTONIX) IV  40 mg Intravenous QHS   polyethylene glycol  17 g Per Tube Daily   sodium bicarbonate       sodium chloride flush  10 mL Intravenous Q12H   sodium chloride flush  3 mL Intravenous Q12H   ticagrelor  90 mg Per Tube BID   vasopressin        Assessment: 57 yom presenting as code STEMI complicated with multiple VF arrests (3 shocks) - now on [place] ECMO. LA 3.4, increasing to >15, now down to 11.4. Received DES to LAD - was on cangrelor prior to transitioning to brilinta. Also received 10,000 units of heparin bolus during procedure.  Hgb 9.9, INR 1.8, aPTT >200, plt 274, fibrinogen 344, LDH 1317. Hematoma at arterial cannula site.   Goal of Therapy:  aPTT 50-70 seconds Monitor platelets by anticoagulation protocol: Yes   Plan:  Given hematoma at cannula site, will  hold on bivalirudin at this time.  Will follow up with team when to start.  Thank you for allowing pharmacy to participate in this patient's care,  Sherron Monday, PharmD, BCCCP Clinical Pharmacist  Phone: [phone] 10/13/2023 3:00 PM  Please check AMION for all [facility] phone numbers After 10:00 PM, call Main Pharmacy [phone]

## 2023-10-13 NOTE — ED Notes (Signed)
Translator used when pt reports he is feeling hard to breath. Pt moaning in bed.

## 2023-10-13 NOTE — ED Notes (Signed)
RT called by this RN for possible intubation in CT at [facility] request after pt went into Vtach & not protecting his airway. Pulses lost & CPR started at 0842, compressions initiated by this RN.

## 2023-10-13 NOTE — Progress Notes (Signed)
Transported from Cath lab to 2H22 without complications

## 2023-10-13 NOTE — ED Notes (Signed)
Arrived back to Room from CT

## 2023-10-13 NOTE — Procedures (Signed)
Central Venous Catheter Insertion Procedure Note  Caleb [name]  [id number]  08-25-1966  Date:10/13/23  Time:9:57 AM   Provider Performing:Reese Senk Salena Saner Katrinka Blazing   Procedure: Insertion of Non-tunneled Central Venous Catheter(36556) without US guidance  Indication(s) Medication administration  Consent Unable to obtain consent due to emergent nature of procedure.  Anesthesia None  Timeout Verified patient identification, verified procedure, site/side was marked, verified correct patient position, special equipment/implants available, medications/allergies/relevant history reviewed, required imaging and test results available.  Sterile Technique Maximal sterile technique including full sterile barrier drape, hand hygiene, sterile gown, sterile gloves, mask, hair covering, sterile ultrasound probe cover (if used).  Procedure Description Area of catheter insertion was cleaned with chlorhexidine and draped in sterile fashion.  Without real-time ultrasound guidance a central venous catheter was placed into the left subclavian vein. Nonpulsatile blood flow and easy flushing noted in all ports.  The catheter was sutured in place and sterile dressing applied.  Complications/Tolerance None; patient tolerated the procedure well. Chest X-ray is ordered to verify placement for internal jugular or subclavian cannulation.   Chest x-ray is not ordered for femoral cannulation.  EBL Minimal  Specimen(s) None

## 2023-10-13 NOTE — ED Notes (Signed)
EDP using [place] at bedside.

## 2023-10-13 NOTE — Progress Notes (Signed)
Pharmacy Antibiotic Note  Caleb [name] is a 57 y.o. male admitted on 10/13/2023 with sepsis.  Pharmacy has been consulted for vancomycin and meropenem dosing.  Presenting as code STEMI complicated with multiple VF arrests (3 shocks) - now on [place] ECMO. LA 3.4, increasing to >15, now down to 11.4. WBC 18.2, afeb. Scr 2.8 (CrCl 24 mL/min). Planning for nephrology consult to start CRRT.   Plan: Vancomycin 1500 mg IV once then 750 mg IV every 24 hours  Meropenem 1g IV every 8 hours once CRRT starts  Monitor cx results, clinical pic, plan for CRRT    Height: 5\' 1"  (154.9 cm) Weight: 72.6 kg (160 lb) (Estimated weight) IBW/kg (Calculated) : 52.3  Temp (24hrs), Avg:97.9 F (36.6 C), Min:97.9 F (36.6 C), Max:97.9 F (36.6 C)  Recent Labs  Lab 10/13/23 0816 10/13/23 0817  CREATININE 1.90*  --   LATICACIDVEN  --  3.2*    Estimated Creatinine Clearance: 37.1 mL/min (A) (by C-G formula based on SCr of 1.9 mg/dL (H)).    Allergies  Allergen Reactions   Lisinopril Rash         Antimicrobials this admission: Vancomycin 12/15 >>  Meropenem 12/15 >>   Dose adjustments this admission: N/A  Microbiology results: None  Thank you for allowing pharmacy to participate in this patient's care,  Sherron Monday, PharmD, BCCCP Clinical Pharmacist  Phone: [phone] 10/13/2023 2:55 PM  Please check AMION for all [facility] Pharmacy phone numbers After 10:00 PM, call Main Pharmacy [phone]

## 2023-10-13 NOTE — Significant Event (Signed)
Rapid Response Event Note   Responded to STEMI page, patient received from EMS. Assisted with care in ED, transport to/from CT, to CLL and in CLL for partial duration of case. See ED RN/EDP/Cardiology notes for full events.   Truddie Crumble, RN

## 2023-10-13 NOTE — Progress Notes (Signed)
   10/13/23 0915  Spiritual Encounters  Type of Visit Initial  Care provided to: Family  Conversation partners present during encounter Nurse;Physician  Referral source Code page  Reason for visit Code (STEMI)  OnCall Visit Yes  Spiritual Framework  Presenting Themes Impactful experiences and emotions;Community and relationships  Community/Connection Family  Patient Stress Factors Health changes  Family Stress Factors Family relationships;Major life changes  Interventions  Spiritual Care Interventions Made Established relationship of care and support;Compassionate presence;Reflective listening;Self-care teaching;Encouragement;Narrative/life review  Intervention Outcomes  Outcomes Connection to spiritual care;Awareness of support;Reduced anxiety;Reduced fear  Spiritual Care Plan  Spiritual Care Issues Still Outstanding Chaplain will continue to follow  Recommendations for Clinical Staff Page chaplain for support [phone]  Follow up plan  Chaplain will check in on family as the day progresses   Patient arrived at the ED as a code STEMI. Patient's health declined rapidly. Chaplain was asked to bring family to the room to assist staff in understanding the events leading up to the ED visit. A translator was utilized. The patient's son speaks english. Chaplain provided support for the son as he shared his concerns. Chaplain escorted family to the consultation room and then to the 2 Heart waiting area. Chaplain will check on the family as the day progresses.   Arlyce Dice, Chaplain Resident [phone]

## 2023-10-13 NOTE — ED Notes (Addendum)
Cath lab team at bedside with ECMO supplies.

## 2023-10-13 NOTE — ED Notes (Signed)
Cardiologist at bedside.  

## 2023-10-13 NOTE — ED Notes (Signed)
Pt transporting to CT 3 with this RN at this time, then plan to go to Cath lab (per EDP & cardiologist)

## 2023-10-13 NOTE — ED Triage Notes (Signed)
Pt BIB [facility] from home d/t SOB starting this morning, family reports he was normal last night. EMS reports upon their arrival he was very diaphoretic, weak, SOB & showing signs of discomfort in his chest. D/t language barrier pt was not able to give any information. Pt was placed on CPAP & spO2 was 94%, pulse in the 80's, BP's were from 96/50 to 200/111. Upon arrival no longer diaphoretic, STEMI was called d/t EMS noting elevation in his inferior/lateral leads. 22g PIV Lt hand, Hx of CHF.

## 2023-10-13 NOTE — Progress Notes (Signed)
ECMO INITIATION   Patient: Caleb [name], 1966-05-26, 57 y.o. Location:   Date of Service:  10/13/2023     Time: 11:27 AM  Date of Admission: 10/13/2023 Admitting diagnosis:   Ht: 5\' 1"  (154.9 cm) Wt: 72.6 kg (Estimated weight) BSA: Body surface area is 1.77 meters squared.  Blood Type: PENDING Allergies:  Allergies  Allergen Reactions   Lisinopril Rash         Past medical history:  Past Medical History:  Diagnosis Date   CAD (coronary artery disease) 09/03/2015   Inf STEMI 2016 tx with DES to RCA and staged PCI with DES x 2 to LAD // Inf STEMI 8/19 - 2/2 stent thrombosis - LHC:  LAD stents patent, OM1 90, dRCA 100 >> PCI:  Thrombectomy and DES to dRCA   Coronary artery disease    a. Inf STEMI 08/2015 s/p DES to RCA then staged DESx2 to prox and distal LAD;  residual 90% small OM1 which is being treated medically.   Former tobacco use    Hyperlipemia    Ischemic cardiomyopathy    a. 2D echo 09/02/15: mild LVH, EF 45-50%, mild HK of inferolateral myocardium, grade 1 DD, mild AI/MR.   ST elevation myocardial infarction (STEMI) of inferior wall (HCC) 08/31/2015   Past surgical history:  Past Surgical History:  Procedure Laterality Date   CARDIAC CATHETERIZATION N/A 08/31/2015   Procedure: Left Heart Cath and Coronary Angiography;  Surgeon: Tonny Bollman, MD;  Location: [facility] INVASIVE CV LAB;  Service: Cardiovascular;  Laterality: N/A;   CARDIAC CATHETERIZATION N/A 08/31/2015   Procedure: Coronary Stent Intervention;  Surgeon: Tonny Bollman, MD;  Location: [facility] INVASIVE CV LAB;  Service: Cardiovascular;  Laterality: N/A;   CARDIAC CATHETERIZATION N/A 09/02/2015   Procedure: Coronary Stent Intervention;  Surgeon: Corky Crafts, MD;  Location: [facility] INVASIVE CV LAB;  Service: Cardiovascular;  Laterality: N/A;   CARDIAC CATHETERIZATION N/A 09/02/2015   Procedure: Left Heart Cath and Coronary Angiography;  Surgeon: Corky Crafts, MD;  Location: [facility] INVASIVE CV LAB;  Service:  Cardiovascular;  Laterality: N/A;   CORONARY/GRAFT ACUTE MI REVASCULARIZATION N/A 06/06/2018   Procedure: Coronary/Graft Acute MI Revascularization;  Surgeon: Corky Crafts, MD;  Location: [facility] INVASIVE CV LAB;  Service: Cardiovascular;  Laterality: N/A;   LEFT HEART CATH AND CORONARY ANGIOGRAPHY N/A 06/06/2018   Procedure: LEFT HEART CATH AND CORONARY ANGIOGRAPHY;  Surgeon: Corky Crafts, MD;  Location: [facility] INVASIVE CV LAB;  Service: Cardiovascular;  Laterality: N/A;   NO PAST SURGERIES      Indication for ECMO: Cardiogenic Shock  ECMO was deployed at 0853 and initiated at 0935  Anticoagulation achieved with Heparin bolus of 5000 units given to patient at 0934. Cannulated for VA  and achieved initial ECMO 3.35LPM  and ECMO 2 SWEEP .    ECMO Cannula Information     Staff Present  Primary Perfusionist Jordon C  Assisting Perfusionist/ECMO Specialist Devota Pace RN  Cannulating Physician Nicholes Mango MD   ECMO Lot Numbers  CardioHelp Console  [id number]  Oxygenator  [id number]  Tubing Pack  [id number]  ECMO Goals  Flow goal  3.5-4.5LPM    Anticoagulation goal      Cardiac goal    MAP >65  Respiratory goal    O2 > 88%  Other goal       ECMO Handoff  Patient Information * Age Height Weight BSA IBW BMI  57 y.o. 5\' 1"  (154.9 cm)  (72.6 kg (Estimated weight) Body surface area is 1.77  meters squared. No data recorded Body mass index is 30.23 kg/m.   Review History * Primary Diagnosis     Prior Cardiac Arrest within 24hrs of ECMO initiation? yes  ECMO and MCS * Type ECMO Flow ECMO Sweep Gases    VA   3.35 LPM   2 sweep      Additional Mechanical Support   Ventilation *    $ Ventilator Initial/Subsequent : Initial, Vent Mode: PRVC, Vt Set: 480 mL, Set Rate: 18 bmp, FiO2 (%): 100 %, I Time: 0.9 Sec(s), PEEP: 5 cmH20     Cannula Size and Locations       Drainage 25 Fr multistage drainage RFV   Return 19 Fr single stage return LFA    *Cannula(e)  sutured and anchored, secured and dressed.   Labs and Imaging *  *Cannulation position verified via imaging on arrival to ICU. Concerns communicated to attending surgeon. Labs reviewed.   All ECMO safety checks complete. ECMO flowsheet initiated, applicable charges captured, LDA's entered/confirmed, imaging and labs verified, blood products available, and report given to Angela Burke RN.

## 2023-10-13 NOTE — Progress Notes (Signed)
At 0840 RN called RT stating they needed RT stat. RT arrived at CT3 to find pt with bag mask ventilation. RT took over assisting ventilation. Pt was then transported from CT back to ED room where ED MD intubated pt.

## 2023-10-13 NOTE — Procedures (Signed)
I have reviewed the pt's renal condition and adjusted the CRRT session to match the needs of the patient.  Vinson Moselle MD  [facility] 10/13/2023, 9:12 PM

## 2023-10-14 ENCOUNTER — Other Ambulatory Visit (HOSPITAL_COMMUNITY): Payer: Self-pay

## 2023-10-14 ENCOUNTER — Inpatient Hospital Stay (HOSPITAL_COMMUNITY): Payer: Commercial Managed Care - PPO

## 2023-10-14 ENCOUNTER — Encounter (HOSPITAL_COMMUNITY): Payer: Self-pay | Admitting: Cardiovascular Disease

## 2023-10-14 DIAGNOSIS — J9601 Acute respiratory failure with hypoxia: Secondary | ICD-10-CM

## 2023-10-14 DIAGNOSIS — I4901 Ventricular fibrillation: Secondary | ICD-10-CM

## 2023-10-14 DIAGNOSIS — I249 Acute ischemic heart disease, unspecified: Secondary | ICD-10-CM

## 2023-10-14 DIAGNOSIS — Z515 Encounter for palliative care: Secondary | ICD-10-CM

## 2023-10-14 DIAGNOSIS — I213 ST elevation (STEMI) myocardial infarction of unspecified site: Secondary | ICD-10-CM

## 2023-10-14 DIAGNOSIS — R570 Cardiogenic shock: Secondary | ICD-10-CM | POA: Diagnosis not present

## 2023-10-14 DIAGNOSIS — I469 Cardiac arrest, cause unspecified: Secondary | ICD-10-CM | POA: Diagnosis not present

## 2023-10-14 DIAGNOSIS — I219 Acute myocardial infarction, unspecified: Secondary | ICD-10-CM

## 2023-10-14 DIAGNOSIS — N179 Acute kidney failure, unspecified: Secondary | ICD-10-CM | POA: Diagnosis not present

## 2023-10-14 DIAGNOSIS — R569 Unspecified convulsions: Secondary | ICD-10-CM

## 2023-10-14 LAB — POCT I-STAT 7, (LYTES, BLD GAS, ICA,H+H)
Acid-Base Excess: 1 mmol/L (ref 0.0–2.0)
Acid-Base Excess: 2 mmol/L (ref 0.0–2.0)
Acid-Base Excess: 5 mmol/L — ABNORMAL HIGH (ref 0.0–2.0)
Acid-Base Excess: 6 mmol/L — ABNORMAL HIGH (ref 0.0–2.0)
Acid-Base Excess: 7 mmol/L — ABNORMAL HIGH (ref 0.0–2.0)
Acid-Base Excess: 4 mmol/L — ABNORMAL HIGH (ref 0.0–2.0)
Acid-Base Excess: 3 mmol/L — ABNORMAL HIGH (ref 0.0–2.0)
Acid-Base Excess: 3 mmol/L — ABNORMAL HIGH (ref 0.0–2.0)
Acid-Base Excess: 3 mmol/L — ABNORMAL HIGH (ref 0.0–2.0)
Acid-Base Excess: 3 mmol/L — ABNORMAL HIGH (ref 0.0–2.0)
Acid-Base Excess: 2 mmol/L (ref 0.0–2.0)
Acid-base deficit: 2 mmol/L (ref 0.0–2.0)
Acid-base deficit: 2 mmol/L (ref 0.0–2.0)
Bicarbonate: 22.6 mmol/L (ref 20.0–28.0)
Bicarbonate: 24.3 mmol/L (ref 20.0–28.0)
Bicarbonate: 25.6 mmol/L (ref 20.0–28.0)
Bicarbonate: 25.9 mmol/L (ref 20.0–28.0)
Bicarbonate: 27.1 mmol/L (ref 20.0–28.0)
Bicarbonate: 27.7 mmol/L (ref 20.0–28.0)
Bicarbonate: 27.9 mmol/L (ref 20.0–28.0)
Bicarbonate: 28 mmol/L (ref 20.0–28.0)
Bicarbonate: 28 mmol/L (ref 20.0–28.0)
Bicarbonate: 28.2 mmol/L — ABNORMAL HIGH (ref 20.0–28.0)
Bicarbonate: 28.5 mmol/L — ABNORMAL HIGH (ref 20.0–28.0)
Bicarbonate: 29.6 mmol/L — ABNORMAL HIGH (ref 20.0–28.0)
Bicarbonate: 30 mmol/L — ABNORMAL HIGH (ref 20.0–28.0)
Calcium, Ion: 0.94 mmol/L — ABNORMAL LOW (ref 1.15–1.40)
Calcium, Ion: 0.96 mmol/L — ABNORMAL LOW (ref 1.15–1.40)
Calcium, Ion: 0.96 mmol/L — ABNORMAL LOW (ref 1.15–1.40)
Calcium, Ion: 0.97 mmol/L — ABNORMAL LOW (ref 1.15–1.40)
Calcium, Ion: 0.99 mmol/L — ABNORMAL LOW (ref 1.15–1.40)
Calcium, Ion: 1.01 mmol/L — ABNORMAL LOW (ref 1.15–1.40)
Calcium, Ion: 1.01 mmol/L — ABNORMAL LOW (ref 1.15–1.40)
Calcium, Ion: 1.01 mmol/L — ABNORMAL LOW (ref 1.15–1.40)
Calcium, Ion: 1.02 mmol/L — ABNORMAL LOW (ref 1.15–1.40)
Calcium, Ion: 1.02 mmol/L — ABNORMAL LOW (ref 1.15–1.40)
Calcium, Ion: 1.03 mmol/L — ABNORMAL LOW (ref 1.15–1.40)
Calcium, Ion: 1.03 mmol/L — ABNORMAL LOW (ref 1.15–1.40)
Calcium, Ion: 1.04 mmol/L — ABNORMAL LOW (ref 1.15–1.40)
HCT: 22 % — ABNORMAL LOW (ref 39.0–52.0)
HCT: 23 % — ABNORMAL LOW (ref 39.0–52.0)
HCT: 23 % — ABNORMAL LOW (ref 39.0–52.0)
HCT: 23 % — ABNORMAL LOW (ref 39.0–52.0)
HCT: 24 % — ABNORMAL LOW (ref 39.0–52.0)
HCT: 24 % — ABNORMAL LOW (ref 39.0–52.0)
HCT: 24 % — ABNORMAL LOW (ref 39.0–52.0)
HCT: 25 % — ABNORMAL LOW (ref 39.0–52.0)
HCT: 25 % — ABNORMAL LOW (ref 39.0–52.0)
HCT: 25 % — ABNORMAL LOW (ref 39.0–52.0)
HCT: 26 % — ABNORMAL LOW (ref 39.0–52.0)
HCT: 26 % — ABNORMAL LOW (ref 39.0–52.0)
HCT: 26 % — ABNORMAL LOW (ref 39.0–52.0)
Hemoglobin: 7.5 g/dL — ABNORMAL LOW (ref 13.0–17.0)
Hemoglobin: 7.8 g/dL — ABNORMAL LOW (ref 13.0–17.0)
Hemoglobin: 7.8 g/dL — ABNORMAL LOW (ref 13.0–17.0)
Hemoglobin: 7.8 g/dL — ABNORMAL LOW (ref 13.0–17.0)
Hemoglobin: 8.2 g/dL — ABNORMAL LOW (ref 13.0–17.0)
Hemoglobin: 8.2 g/dL — ABNORMAL LOW (ref 13.0–17.0)
Hemoglobin: 8.2 g/dL — ABNORMAL LOW (ref 13.0–17.0)
Hemoglobin: 8.5 g/dL — ABNORMAL LOW (ref 13.0–17.0)
Hemoglobin: 8.5 g/dL — ABNORMAL LOW (ref 13.0–17.0)
Hemoglobin: 8.5 g/dL — ABNORMAL LOW (ref 13.0–17.0)
Hemoglobin: 8.8 g/dL — ABNORMAL LOW (ref 13.0–17.0)
Hemoglobin: 8.8 g/dL — ABNORMAL LOW (ref 13.0–17.0)
Hemoglobin: 8.8 g/dL — ABNORMAL LOW (ref 13.0–17.0)
O2 Saturation: 100 %
O2 Saturation: 100 %
O2 Saturation: 100 %
O2 Saturation: 100 %
O2 Saturation: 100 %
O2 Saturation: 75 %
O2 Saturation: 85 %
O2 Saturation: 86 %
O2 Saturation: 87 %
O2 Saturation: 96 %
O2 Saturation: 98 %
O2 Saturation: 98 %
O2 Saturation: 99 %
Patient temperature: 36.5
Patient temperature: 36.5
Patient temperature: 36.6
Patient temperature: 36.6
Patient temperature: 36.6
Patient temperature: 36.6
Patient temperature: 36.7
Patient temperature: 36.7
Patient temperature: 36.7
Patient temperature: 36.8
Patient temperature: 36.8
Patient temperature: 36.9
Patient temperature: 36.9
Potassium: 2.7 mmol/L — CL (ref 3.5–5.1)
Potassium: 2.9 mmol/L — ABNORMAL LOW (ref 3.5–5.1)
Potassium: 2.9 mmol/L — ABNORMAL LOW (ref 3.5–5.1)
Potassium: 3.2 mmol/L — ABNORMAL LOW (ref 3.5–5.1)
Potassium: 3.4 mmol/L — ABNORMAL LOW (ref 3.5–5.1)
Potassium: 3.4 mmol/L — ABNORMAL LOW (ref 3.5–5.1)
Potassium: 3.4 mmol/L — ABNORMAL LOW (ref 3.5–5.1)
Potassium: 3.4 mmol/L — ABNORMAL LOW (ref 3.5–5.1)
Potassium: 3.7 mmol/L (ref 3.5–5.1)
Potassium: 3.9 mmol/L (ref 3.5–5.1)
Potassium: 4 mmol/L (ref 3.5–5.1)
Potassium: 4.2 mmol/L (ref 3.5–5.1)
Potassium: 4.7 mmol/L (ref 3.5–5.1)
Sodium: 134 mmol/L — ABNORMAL LOW (ref 135–145)
Sodium: 135 mmol/L (ref 135–145)
Sodium: 135 mmol/L (ref 135–145)
Sodium: 135 mmol/L (ref 135–145)
Sodium: 136 mmol/L (ref 135–145)
Sodium: 137 mmol/L (ref 135–145)
Sodium: 138 mmol/L (ref 135–145)
Sodium: 138 mmol/L (ref 135–145)
Sodium: 139 mmol/L (ref 135–145)
Sodium: 140 mmol/L (ref 135–145)
Sodium: 140 mmol/L (ref 135–145)
Sodium: 140 mmol/L (ref 135–145)
Sodium: 141 mmol/L (ref 135–145)
TCO2: 24 mmol/L (ref 22–32)
TCO2: 26 mmol/L (ref 22–32)
TCO2: 27 mmol/L (ref 22–32)
TCO2: 27 mmol/L (ref 22–32)
TCO2: 28 mmol/L (ref 22–32)
TCO2: 29 mmol/L (ref 22–32)
TCO2: 29 mmol/L (ref 22–32)
TCO2: 29 mmol/L (ref 22–32)
TCO2: 29 mmol/L (ref 22–32)
TCO2: 30 mmol/L (ref 22–32)
TCO2: 30 mmol/L (ref 22–32)
TCO2: 31 mmol/L (ref 22–32)
TCO2: 31 mmol/L (ref 22–32)
pCO2 arterial: 32.8 mm[Hg] (ref 32–48)
pCO2 arterial: 34.7 mm[Hg] (ref 32–48)
pCO2 arterial: 34.9 mm[Hg] (ref 32–48)
pCO2 arterial: 36.3 mm[Hg] (ref 32–48)
pCO2 arterial: 36.5 mm[Hg] (ref 32–48)
pCO2 arterial: 37.2 mm[Hg] (ref 32–48)
pCO2 arterial: 37.6 mm[Hg] (ref 32–48)
pCO2 arterial: 40 mm[Hg] (ref 32–48)
pCO2 arterial: 42.7 mm[Hg] (ref 32–48)
pCO2 arterial: 45.9 mm[Hg] (ref 32–48)
pCO2 arterial: 47.3 mm[Hg] (ref 32–48)
pCO2 arterial: 48.3 mm[Hg] — ABNORMAL HIGH (ref 32–48)
pCO2 arterial: 48.4 mm[Hg] — ABNORMAL HIGH (ref 32–48)
pH, Arterial: 7.306 — ABNORMAL LOW (ref 7.35–7.45)
pH, Arterial: 7.373 (ref 7.35–7.45)
pH, Arterial: 7.375 (ref 7.35–7.45)
pH, Arterial: 7.4 (ref 7.35–7.45)
pH, Arterial: 7.402 (ref 7.35–7.45)
pH, Arterial: 7.424 (ref 7.35–7.45)
pH, Arterial: 7.437 (ref 7.35–7.45)
pH, Arterial: 7.445 (ref 7.35–7.45)
pH, Arterial: 7.478 — ABNORMAL HIGH (ref 7.35–7.45)
pH, Arterial: 7.478 — ABNORMAL HIGH (ref 7.35–7.45)
pH, Arterial: 7.522 — ABNORMAL HIGH (ref 7.35–7.45)
pH, Arterial: 7.536 — ABNORMAL HIGH (ref 7.35–7.45)
pH, Arterial: 7.538 — ABNORMAL HIGH (ref 7.35–7.45)
pO2, Arterial: 102 mm[Hg] (ref 83–108)
pO2, Arterial: 147 mm[Hg] — ABNORMAL HIGH (ref 83–108)
pO2, Arterial: 189 mm[Hg] — ABNORMAL HIGH (ref 83–108)
pO2, Arterial: 228 mm[Hg] — ABNORMAL HIGH (ref 83–108)
pO2, Arterial: 286 mm[Hg] — ABNORMAL HIGH (ref 83–108)
pO2, Arterial: 331 mm[Hg] — ABNORMAL HIGH (ref 83–108)
pO2, Arterial: 393 mm[Hg] — ABNORMAL HIGH (ref 83–108)
pO2, Arterial: 40 mm[Hg] — CL (ref 83–108)
pO2, Arterial: 49 mm[Hg] — ABNORMAL LOW (ref 83–108)
pO2, Arterial: 50 mm[Hg] — ABNORMAL LOW (ref 83–108)
pO2, Arterial: 54 mm[Hg] — ABNORMAL LOW (ref 83–108)
pO2, Arterial: 85 mm[Hg] (ref 83–108)
pO2, Arterial: 97 mm[Hg] (ref 83–108)

## 2023-10-14 LAB — CBC
HCT: 23.8 % — ABNORMAL LOW (ref 39.0–52.0)
HCT: 25.1 % — ABNORMAL LOW (ref 39.0–52.0)
HCT: 27 % — ABNORMAL LOW (ref 39.0–52.0)
Hemoglobin: 7.8 g/dL — ABNORMAL LOW (ref 13.0–17.0)
Hemoglobin: 8.4 g/dL — ABNORMAL LOW (ref 13.0–17.0)
Hemoglobin: 8.7 g/dL — ABNORMAL LOW (ref 13.0–17.0)
MCH: 26.4 pg (ref 26.0–34.0)
MCH: 26.6 pg (ref 26.0–34.0)
MCH: 26.8 pg (ref 26.0–34.0)
MCHC: 32.2 g/dL (ref 30.0–36.0)
MCHC: 32.8 g/dL (ref 30.0–36.0)
MCHC: 33.5 g/dL (ref 30.0–36.0)
MCV: 80.2 fL (ref 80.0–100.0)
MCV: 81.2 fL (ref 80.0–100.0)
MCV: 82.1 fL (ref 80.0–100.0)
Platelets: 108 10*3/uL — ABNORMAL LOW (ref 150–400)
Platelets: 142 10*3/uL — ABNORMAL LOW (ref 150–400)
Platelets: 169 10*3/uL (ref 150–400)
RBC: 2.93 MIL/uL — ABNORMAL LOW (ref 4.22–5.81)
RBC: 3.13 MIL/uL — ABNORMAL LOW (ref 4.22–5.81)
RBC: 3.29 MIL/uL — ABNORMAL LOW (ref 4.22–5.81)
RDW: 14.1 % (ref 11.5–15.5)
RDW: 14.2 % (ref 11.5–15.5)
RDW: 14.2 % (ref 11.5–15.5)
WBC: 4.5 10*3/uL (ref 4.0–10.5)
WBC: 6 10*3/uL (ref 4.0–10.5)
WBC: 7.2 10*3/uL (ref 4.0–10.5)
nRBC: 0 % (ref 0.0–0.2)
nRBC: 0.3 % — ABNORMAL HIGH (ref 0.0–0.2)
nRBC: 0.3 % — ABNORMAL HIGH (ref 0.0–0.2)

## 2023-10-14 LAB — APTT
aPTT: 49 s — ABNORMAL HIGH (ref 24–36)
aPTT: 53 s — ABNORMAL HIGH (ref 24–36)
aPTT: 56 s — ABNORMAL HIGH (ref 24–36)
aPTT: 59 s — ABNORMAL HIGH (ref 24–36)
aPTT: 69 s — ABNORMAL HIGH (ref 24–36)

## 2023-10-14 LAB — RENAL FUNCTION PANEL
Albumin: 2.2 g/dL — ABNORMAL LOW (ref 3.5–5.0)
Albumin: 2 g/dL — ABNORMAL LOW (ref 3.5–5.0)
Anion gap: 14 (ref 5–15)
Anion gap: 10 (ref 5–15)
BUN: 15 mg/dL (ref 6–20)
BUN: 11 mg/dL (ref 6–20)
CO2: 24 mmol/L (ref 22–32)
CO2: 26 mmol/L (ref 22–32)
Calcium: 7.1 mg/dL — ABNORMAL LOW (ref 8.9–10.3)
Calcium: 7.2 mg/dL — ABNORMAL LOW (ref 8.9–10.3)
Chloride: 101 mmol/L (ref 98–111)
Chloride: 101 mmol/L (ref 98–111)
Creatinine, Ser: 2.09 mg/dL — ABNORMAL HIGH (ref 0.61–1.24)
Creatinine, Ser: 1.61 mg/dL — ABNORMAL HIGH (ref 0.61–1.24)
GFR, Estimated: 36 mL/min — ABNORMAL LOW (ref >60)
GFR, Estimated: 50 mL/min — ABNORMAL LOW (ref >60)
Glucose, Bld: 145 mg/dL — ABNORMAL HIGH (ref 70–99)
Glucose, Bld: 99 mg/dL (ref 70–99)
Phosphorus: 1 mg/dL — CL (ref 2.5–4.6)
Phosphorus: 4.5 mg/dL (ref 2.5–4.6)
Potassium: 3.2 mmol/L — ABNORMAL LOW (ref 3.5–5.1)
Potassium: 4 mmol/L (ref 3.5–5.1)
Sodium: 139 mmol/L (ref 135–145)
Sodium: 137 mmol/L (ref 135–145)

## 2023-10-14 LAB — LACTATE DEHYDROGENASE: LDH: 1321 U/L — ABNORMAL HIGH (ref 98–192)

## 2023-10-14 LAB — MAGNESIUM: Magnesium: 2.3 mg/dL (ref 1.7–2.4)

## 2023-10-14 LAB — GLUCOSE, CAPILLARY
Glucose-Capillary: 109 mg/dL — ABNORMAL HIGH (ref 70–99)
Glucose-Capillary: 110 mg/dL — ABNORMAL HIGH (ref 70–99)
Glucose-Capillary: 112 mg/dL — ABNORMAL HIGH (ref 70–99)
Glucose-Capillary: 112 mg/dL — ABNORMAL HIGH (ref 70–99)
Glucose-Capillary: 113 mg/dL — ABNORMAL HIGH (ref 70–99)
Glucose-Capillary: 114 mg/dL — ABNORMAL HIGH (ref 70–99)
Glucose-Capillary: 119 mg/dL — ABNORMAL HIGH (ref 70–99)
Glucose-Capillary: 122 mg/dL — ABNORMAL HIGH (ref 70–99)
Glucose-Capillary: 125 mg/dL — ABNORMAL HIGH (ref 70–99)
Glucose-Capillary: 131 mg/dL — ABNORMAL HIGH (ref 70–99)
Glucose-Capillary: 137 mg/dL — ABNORMAL HIGH (ref 70–99)
Glucose-Capillary: 146 mg/dL — ABNORMAL HIGH (ref 70–99)
Glucose-Capillary: 163 mg/dL — ABNORMAL HIGH (ref 70–99)
Glucose-Capillary: 209 mg/dL — ABNORMAL HIGH (ref 70–99)
Glucose-Capillary: 244 mg/dL — ABNORMAL HIGH (ref 70–99)
Glucose-Capillary: 281 mg/dL — ABNORMAL HIGH (ref 70–99)
Glucose-Capillary: 80 mg/dL (ref 70–99)
Glucose-Capillary: 96 mg/dL (ref 70–99)

## 2023-10-14 LAB — BASIC METABOLIC PANEL
Anion gap: 15 (ref 5–15)
BUN: 18 mg/dL (ref 6–20)
CO2: 21 mmol/L — ABNORMAL LOW (ref 22–32)
Calcium: 7.2 mg/dL — ABNORMAL LOW (ref 8.9–10.3)
Chloride: 102 mmol/L (ref 98–111)
Creatinine, Ser: 2.25 mg/dL — ABNORMAL HIGH (ref 0.61–1.24)
GFR, Estimated: 33 mL/min — ABNORMAL LOW (ref >60)
Glucose, Bld: 320 mg/dL — ABNORMAL HIGH (ref 70–99)
Potassium: 2.7 mmol/L — CL (ref 3.5–5.1)
Sodium: 138 mmol/L (ref 135–145)

## 2023-10-14 LAB — PROTIME-INR
INR: 1.8 — ABNORMAL HIGH (ref 0.8–1.2)
Prothrombin Time: 21.2 s — ABNORMAL HIGH (ref 11.4–15.2)

## 2023-10-14 LAB — HEPATIC FUNCTION PANEL
ALT: 112 U/L — ABNORMAL HIGH (ref 0–44)
AST: 612 U/L — ABNORMAL HIGH (ref 15–41)
Albumin: 2.2 g/dL — ABNORMAL LOW (ref 3.5–5.0)
Alkaline Phosphatase: 47 U/L (ref 38–126)
Bilirubin, Direct: 0.4 mg/dL — ABNORMAL HIGH (ref 0.0–0.2)
Indirect Bilirubin: 1.2 mg/dL — ABNORMAL HIGH (ref 0.3–0.9)
Total Bilirubin: 1.6 mg/dL — ABNORMAL HIGH (ref <1.2)
Total Protein: 3.7 g/dL — ABNORMAL LOW (ref 6.5–8.1)

## 2023-10-14 LAB — FIBRINOGEN: Fibrinogen: 241 mg/dL (ref 210–475)

## 2023-10-14 LAB — PREPARE RBC (CROSSMATCH)

## 2023-10-14 LAB — CG4 I-STAT (LACTIC ACID)
Lactic Acid, Venous: 10.4 mmol/L (ref 0.5–1.9)
Lactic Acid, Venous: 8.3 mmol/L (ref 0.5–1.9)
Lactic Acid, Venous: 5.3 mmol/L (ref 0.5–1.9)

## 2023-10-14 LAB — HEMOGLOBIN A1C
Hgb A1c MFr Bld: 6.4 % — ABNORMAL HIGH (ref 4.8–5.6)
Mean Plasma Glucose: 136.98 mg/dL

## 2023-10-14 LAB — LIDOCAINE LEVEL: Lidocaine Lvl: 3.4 ug/mL (ref 1.5–5.0)

## 2023-10-14 LAB — VITAMIN D 25 HYDROXY (VIT D DEFICIENCY, FRACTURES): Vit D, 25-Hydroxy: 18.75 ng/mL — ABNORMAL LOW (ref 30–100)

## 2023-10-14 MED ORDER — POTASSIUM CHLORIDE 10 MEQ/50ML IV SOLN
10.0000 meq | INTRAVENOUS | Status: AC
Start: 2023-10-14 — End: 2023-10-14
  Administered 2023-10-14 (×6): 10 meq via INTRAVENOUS
  Filled 2023-10-14 (×6): qty 50

## 2023-10-14 MED ORDER — SODIUM CHLORIDE 0.9 % IV SOLN
0.0120 mg/kg/h | INTRAVENOUS | Status: DC
  Administered 2023-10-14: 0.01 mg/kg/h via INTRAVENOUS
  Filled 2023-10-14: qty 250

## 2023-10-14 MED ORDER — INSULIN DETEMIR 100 UNIT/ML ~~LOC~~ SOLN
8.0000 [IU] | Freq: Two times a day (BID) | SUBCUTANEOUS | Status: DC
  Administered 2023-10-14 – 2023-10-15 (×2): 8 [IU] via SUBCUTANEOUS
  Filled 2023-10-14 (×4): qty 0.08

## 2023-10-14 MED ORDER — FUROSEMIDE 10 MG/ML IJ SOLN
120.0000 mg | Freq: Once | INTRAVENOUS | Status: AC
  Administered 2023-10-14: 120 mg via INTRAVENOUS
  Filled 2023-10-14: qty 2

## 2023-10-14 MED ORDER — PRISMASOL BGK 4/2.5 32-4-2.5 MEQ/L REPLACEMENT SOLN: Status: DC

## 2023-10-14 MED ORDER — SODIUM CHLORIDE 0.9% IV SOLUTION
Freq: Once | INTRAVENOUS | Status: DC
Start: 2023-10-14 — End: 2023-10-16

## 2023-10-14 MED ORDER — POTASSIUM PHOSPHATES 15 MMOLE/5ML IV SOLN
30.0000 mmol | Freq: Once | INTRAVENOUS | Status: AC
  Administered 2023-10-14: 30 mmol via INTRAVENOUS
  Filled 2023-10-14: qty 10

## 2023-10-14 MED ORDER — ROCURONIUM BROMIDE 10 MG/ML (PF) SYRINGE: 100.0000 mg | PREFILLED_SYRINGE | Freq: Once | INTRAVENOUS | Status: AC

## 2023-10-14 MED ORDER — ROCURONIUM BROMIDE 10 MG/ML (PF) SYRINGE
PREFILLED_SYRINGE | INTRAVENOUS | Status: AC
  Filled 2023-10-14: qty 10

## 2023-10-14 MED ORDER — ROCURONIUM BROMIDE 10 MG/ML (PF) SYRINGE
100.0000 mg | PREFILLED_SYRINGE | Freq: Once | INTRAVENOUS | Status: AC
  Administered 2023-10-14: 100 mg via INTRAVENOUS

## 2023-10-14 MED ORDER — ALBUMIN HUMAN 25 % IV SOLN
12.5000 g | Freq: Once | INTRAVENOUS | Status: DC
  Filled 2023-10-14: qty 50

## 2023-10-14 MED ORDER — RENA-VITE PO TABS
1.0000 | ORAL_TABLET | Freq: Every day | ORAL | Status: DC
Start: 2023-10-14 — End: 2023-10-16
  Administered 2023-10-14 – 2023-10-15 (×2): 1
  Filled 2023-10-14 (×2): qty 1

## 2023-10-14 MED ORDER — ALBUMIN HUMAN 5 % IV SOLN
12.5000 g | Freq: Once | INTRAVENOUS | Status: AC
  Administered 2023-10-14: 12.5 g via INTRAVENOUS

## 2023-10-14 MED ORDER — INSULIN ASPART 100 UNIT/ML IJ SOLN
2.0000 [IU] | INTRAMUSCULAR | Status: DC
  Administered 2023-10-15: 2 [IU] via SUBCUTANEOUS

## 2023-10-14 MED ORDER — CALCIUM GLUCONATE-NACL 2-0.675 GM/100ML-% IV SOLN
2.0000 g | Freq: Once | INTRAVENOUS | Status: AC
  Administered 2023-10-14: 2000 mg via INTRAVENOUS
  Filled 2023-10-14: qty 100

## 2023-10-14 MED ORDER — ROCURONIUM BROMIDE 10 MG/ML (PF) SYRINGE
PREFILLED_SYRINGE | INTRAVENOUS | Status: AC
  Administered 2023-10-14: 100 mg
  Filled 2023-10-14: qty 10

## 2023-10-14 NOTE — Progress Notes (Signed)
OT Cancellation Note  Patient Details Name: Caleb [name] MRN: [id number] DOB: [birth year]   Cancelled Treatment:    Reason Eval/Treat Not Completed: Medical issues which prohibited therapy. Order cancelled due to medical instability at this time. Please re-consult as medically appropriate/ready. Thank you.   [name], OTR/L [facility] Acute Rehabilitation Office: [phone]   [name] 10/14/2023, 8:57 AM

## 2023-10-14 NOTE — Consult Note (Signed)
Consultation Note Date: 10/14/2023   Patient Name: Caleb [name]  DOB: May 02, 1966  MRN: [id number]  Age / Sex: 57 y.o., male  PCP: Patient, No Pcp Per Referring Physician: Cheri Fowler, MD  Reason for Consultation: Establishing goals of care  HPI/Patient Profile: 57 y.o. male   admitted on 10/13/2023 with w/ hx of CAD s/p PCI in 2019 presenting with crushing chest pain. EKG with STEMI. Pain was a little abnormal in character so taken for dissection protocol CTA which was negative.   In CT Vfib arrest x 3 with ROSC, brought emergently to cath lab with LUCAS compressions ongoing.    Ischemic cardiomyopathy given EF of 45 to 50% in 2016  Patient remains on multiple vasopressors, CRRT, Impella.  Patient is critically ill  Family face treatment option decisions, advanced directive decisions and anticipatory care needs.  Clinical Assessment and Goals of Care:  This NP Lorinda Creed reviewed medical records, received report from team, assessed the patient and then meet at the patient's bedside  with a son and daughter to discuss diagnosis, prognosis, GOC, EOL wishes disposition and options.   Concept of Palliative Care was introduced as specialized medical care for people and their families living with serious illness.  If focuses on providing relief from the symptoms and stress of a serious illness.  The goal is to improve quality of life for both the patient and the family.  Values and goals of care important to patient and family were attempted to be elicited.  Daughter speaks English well but in order to eliminate any miscommunication, I have secured a interpreter for tomorrow morning at 12:00.  Family agrees to be present Patient's mother is living, however her daughter does not think that she will come to the hospital.  . Questions and concerns addressed.  Family encouraged to call with questions or concerns,  gave them my contact information   PMT will continue to support holistically.         No documented H POA or advance care planning documents Decision maker will be further clarified tomorrow morning with the assistance of an interpreter     SUMMARY OF RECOMMENDATIONS    Code Status/Advance Care Planning: Full code    Palliative Prophylaxis:  Aspiration, Delirium Protocol, and Frequent Pain Assessment  Additional Recommendations (Limitations, Scope, Preferences): Full Scope Treatment   Prognosis:  Unable to determine  Discharge Planning: To Be Determined      Primary Diagnoses: Present on Admission:  Cardiogenic shock (HCC)  STEMI (ST elevation myocardial infarction) (HCC)   I have reviewed the medical record, interviewed the patient and family, and examined the patient. The following aspects are pertinent.  Past Medical History:  Diagnosis Date   CAD (coronary artery disease) 09/03/2015   Inf STEMI 2016 tx with DES to RCA and staged PCI with DES x 2 to LAD // Inf STEMI 8/19 - 2/2 stent thrombosis - LHC:  LAD stents patent, OM1 90, dRCA 100 >> PCI:  Thrombectomy and DES to dRCA   Coronary artery  disease    a. Inf STEMI 08/2015 s/p DES to RCA then staged DESx2 to prox and distal LAD;  residual 90% small OM1 which is being treated medically.   Former tobacco use    Hyperlipemia    Ischemic cardiomyopathy    a. 2D echo 09/02/15: mild LVH, EF 45-50%, mild HK of inferolateral myocardium, grade 1 DD, mild AI/MR.   ST elevation myocardial infarction (STEMI) of inferior wall (HCC) 08/31/2015   Social History   Socioeconomic History   Marital status: Single    Spouse name: Not on file   Number of children: Not on file   Years of education: Not on file   Highest education level: Not on file  Occupational History   Not on file  Tobacco Use   Smoking status: Former   Smokeless tobacco: Never  Vaping Use   Vaping status: Never Used  Substance and Sexual Activity    Alcohol use: No    Alcohol/week: 0.0 standard drinks of alcohol   Drug use: No   Sexual activity: Not on file  Other Topics Concern   Not on file  Social History Narrative   Not on file   Social Drivers of Health   Financial Resource Strain: Not on file  Food Insecurity: Patient Unable To Answer (10/13/2023)   Hunger Vital Sign    Worried About Running Out of Food in the Last Year: Patient unable to answer    Ran Out of Food in the Last Year: Patient unable to answer  Transportation Needs: Patient Unable To Answer (10/13/2023)   PRAPARE - Administrator, Civil Service (Medical): Patient unable to answer    Lack of Transportation (Non-Medical): Patient unable to answer  Physical Activity: Not on file  Stress: Not on file  Social Connections: Not on file   Family History  Problem Relation Age of Onset   Other Unknown        Patient does not know if any hx of CAD   Scheduled Meds:  aspirin  81 mg Per Tube Daily   Chlorhexidine Gluconate Cloth  6 each Topical Daily   docusate  100 mg Per Tube BID   fentaNYL  50 mcg Intravenous Once   mouth rinse  15 mL Mouth Rinse Q2H   pantoprazole (PROTONIX) IV  40 mg Intravenous QHS   polyethylene glycol  17 g Per Tube Daily   sodium chloride flush  10 mL Intravenous Q12H   sodium chloride flush  3 mL Intravenous Q12H   ticagrelor  90 mg Per Tube BID   Continuous Infusions:   prismasol BGK 4/2.5      prismasol BGK 4/2.5     sodium chloride Stopped (10/14/23 0751)   amiodarone 60 mg/hr (10/14/23 0800)   dextrose 5 % and 0.45 % NaCl     epinephrine 11 mcg/min (10/14/23 0800)   fentaNYL infusion INTRAVENOUS 200 mcg/hr (10/14/23 0800)   insulin 1.3 Units/hr (10/14/23 0800)   lidocaine 1 mg/min (10/14/23 0800)   meropenem (MERREM) IV 200 mL/hr at 10/14/23 0800   midazolam 6 mg/hr (10/14/23 0800)   norepinephrine (LEVOPHED) Adult infusion 27 mcg/min (10/14/23 0822)   potassium PHOSPHATE IVPB (in mmol)     prismasol BGK 4/2.5  1,500 mL/hr at 10/14/23 0536   sodium bicarbonate 25 mEq (Impella PURGE) in dextrose 5 % 1000 mL bag 200 mL/hr at 10/13/23 1046   vancomycin     vasopressin 0.03 Units/min (10/14/23 0800)   PRN Meds:.sodium chloride, dextrose,  fentaNYL, heparin, heparin, midazolam, mouth rinse, sodium chloride flush Medications Prior to Admission:  Prior to Admission medications   Medication Sig Start Date End Date Taking? Authorizing Provider  moxifloxacin (AVELOX) 400 MG tablet Take 400 mg by mouth daily at 8 pm.   Yes [provider]  nitroGLYCERIN (NITROSTAT) 0.4 MG SL tablet Place 1 tablet (0.4 mg total) under the tongue every 5 (five) minutes as needed for chest pain. 06/08/18 10/13/23 Yes Weaver, Scott T, PA-C  atorvastatin (LIPITOR) 80 MG tablet Take 1 tablet (80 mg total) by mouth daily at 6 PM. 06/08/18 06/08/19  Tereso Newcomer T, PA-C  carvedilol (COREG) 3.125 MG tablet Take 1 tablet (3.125 mg total) by mouth 2 (two) times daily with a meal. 06/08/18 06/08/19  Tereso Newcomer T, PA-C   Allergies  Allergen Reactions   Lisinopril Rash        Review of Systems  Physical Exam Constitutional:      Appearance: He is ill-appearing.     Interventions: He is intubated.  Cardiovascular:     Rate and Rhythm: Normal rate.  Pulmonary:     Effort: He is intubated.  Skin:    General: Skin is warm and dry.     Vital Signs: BP (!) 83/80   Pulse 82   Temp 97.9 F (36.6 C)   Resp 20   Ht 5\' 1"  (1.549 m)   Wt 72.6 kg Comment: Estimated weight  SpO2 100% Comment: per ABG  BMI 30.23 kg/m  Pain Scale: CPOT       SpO2: SpO2: 100 % (per ABG) O2 Device:SpO2: 100 % (per ABG) O2 Flow Rate: .O2 Flow Rate (L/min): 60 L/min  IO: Intake/output summary:  Intake/Output Summary (Last 24 hours) at 10/14/2023 0850 Last data filed at 10/14/2023 0800 Gross per 24 hour  Intake 8258.87 ml  Output 901.8 ml  Net 7357.07 ml    LBM: Last BM Date : 10/13/23 Baseline Weight: Weight: 72.6 kg (Estimated  weight) Most recent weight: Weight: 72.6 kg (Estimated weight)     Palliative Assessment/Data: Intubated    Time 50 minutes  Detailed review of medical records ( labs, imaging, vital signs), medically appropriate exam ( MS, skin, cardiac,  resp)   discussed with treatment team, counseling and education to patient, family, staff, documenting clinical information, medication management, coordination of care   Signed by: Lorinda Creed, NP   Please contact Palliative Medicine Team phone at [phone] for questions and concerns.  For individual provider: See Loretha Stapler

## 2023-10-14 NOTE — TOC Initial Note (Signed)
Transition of Care [facility]) - Initial/Assessment Note    Patient Details  Name: Caleb [name] MRN: [id number] Date of Birth: Mar 28, 1966  Transition of Care [facility]) CM/SW Contact:    Elliot Cousin, RN Phone Number: [phone] 10/14/2023, 5:04 PM  Clinical Narrative:                 CM spoke to pt's dtr at bedside. Pt was working prior to hospitalization. Wife and adult children are in the home to assist with care.  Will continue to follow for dc needs.   Expected Discharge Plan: IP Rehab Facility Barriers to Discharge: Continued Medical Work up   Patient Goals and CMS Choice            Expected Discharge Plan and Services                                              Prior Living Arrangements/Services                       Activities of Daily Living   ADL Screening (condition at time of admission) Independently performs ADLs?: Yes (appropriate for developmental age) Is the patient deaf or have difficulty hearing?: No Does the patient have difficulty seeing, even when wearing glasses/contacts?: No Does the patient have difficulty concentrating, remembering, or making decisions?: No  Permission Sought/Granted                  Emotional Assessment   Attitude/Demeanor/Rapport: Intubated (Following Commands or Not Following Commands)          Admission diagnosis:  Cardiogenic shock (HCC) [R57.0] STEMI (ST elevation myocardial infarction) (HCC) [I21.3] Patient Active Problem List   Diagnosis Date Noted   Cardiogenic shock (HCC) 10/13/2023   STEMI (ST elevation myocardial infarction) (HCC) 10/13/2023   Acute ST elevation myocardial infarction (STEMI) of inferior wall (HCC) 06/06/2018   CAD (coronary artery disease) 09/03/2015   Hyperlipidemia 09/03/2015   Former tobacco use 09/03/2015   Cardiomyopathy, ischemic 09/03/2015   ST elevation myocardial infarction (STEMI) of inferior wall (HCC) 08/31/2015   PCP:  Patient, No Pcp  Per Pharmacy:   [id number] [place],  - [address] AT [facility] OF [address] [place] [address] Phone: [phone] Fax: [phone]     Social Drivers of Health (SDOH) Social History: SDOH Screenings   Food Insecurity: Patient Unable To Answer (10/13/2023)  Housing: Patient Unable To Answer (10/13/2023)  Transportation Needs: Patient Unable To Answer (10/13/2023)  Utilities: Patient Unable To Answer (10/13/2023)  Tobacco Use: Medium Risk (10/13/2023)   SDOH Interventions:     Readmission Risk Interventions     No data to display

## 2023-10-14 NOTE — Progress Notes (Signed)
Initial Nutrition Assessment  DOCUMENTATION CODES:   Not applicable  INTERVENTION:   Recommend considering Cortrak placement when appropriate; discussed with Dr. Merrily Pew and will consider placement on Wednesday  Recommend initiation of trickle TF within 24 hours or as soon as medically able  Tube Feeding Recommendations:  Pivot 1.5 at 20 ml/hr with goal of 45 ml/hr Pro-Source TF20 60 mL daily TF at goal rate provides 113 g of protein, 1700 kcals and 820 mL of free water  Add Renal MVI daily   NUTRITION DIAGNOSIS:   Increased nutrient needs related to acute illness as evidenced by estimated needs.  GOAL:   Patient will meet greater than or equal to 90% of their needs  Progressing  MONITOR:   Vent status, Labs, Weight trends, TF tolerance, Skin  REASON FOR ASSESSMENT:   Consult Assessment of nutrition requirement/status (ECMO and CRRT)  ASSESSMENT:   57 yo male admitted with STEMI and subsequently with Vfib arrest x 3, cannulated for VA ECMO and Impella. Intubated, AKI requiring CRRT. PMH includes CAD w prior PCI. Pt is [place].  12/15 Admitted, Intubated, VA ECMO cannulation  Pt remains sedated on vent support, VA ECMO with Impella (venting), CRRT Lactate trending down Levophed 12, vasopressin 0.03, Epinephrine at 9  OG tube with tip in stomach  Hypophosphatemia present and pt on CRRT, phos being supplemented but pt will likely require continued supplementation of phos  Current weight 72.6 kg from 12/15; no new weight today. Current dry weight unknown. +edema  Net + 7 L per I/O flow sheet; oliguric currently  Labs: phosphorus 1.0 (L)-K Phos prdered Meds: bival, colace, insulin gtt, miralax   NUTRITION - FOCUSED PHYSICAL EXAM:  Unable to assess  Diet Order:   Diet Order             Diet NPO time specified  Diet effective now                   EDUCATION NEEDS:   Not appropriate for education at this time  Skin:  Skin Assessment: Reviewed  RN Assessment  Last BM:  12/15  Height:   Ht Readings from Last 1 Encounters:  10/13/23 5\' 1"  (1.549 m)    Weight:   Wt Readings from Last 1 Encounters:  10/13/23 72.6 kg     BMI:  Body mass index is 30.23 kg/m.  Estimated Nutritional Needs:   Kcal:  1650-1850 kcals  Protein:  110-135 g  Fluid:  1.5 L   Romelle Starcher MS, RDN, LDN, CNSC Registered Dietitian 3 Clinical Nutrition RD Inpatient Contact Info in Amion

## 2023-10-14 NOTE — Progress Notes (Signed)
PHARMACY - ANTICOAGULATION CONSULT NOTE  Pharmacy Consult for bivalirudin  Indication:  ECMO/Impella CP  Allergies  Allergen Reactions   Lisinopril Rash         Patient Measurements: Height: 5\' 1"  (154.9 cm) Weight: 72.6 kg (160 lb) (Estimated weight) IBW/kg (Calculated) : 52.3 Heparin Dosing Weight: 67.5 kg  Vital Signs: Temp: 98.2 F (36.8 C) (12/16 2330) Temp Source: Core (12/16 2000) BP: 79/74 (12/16 1957) Pulse Rate: 74 (12/16 1957)  Labs: Recent Labs    10/13/23 1210 10/13/23 1311 10/14/23 0025 10/14/23 0118 10/14/23 0421 10/14/23 0615 10/14/23 0800 10/14/23 1009 10/14/23 1559 10/14/23 1600 10/14/23 1606 10/14/23 1709 10/14/23 1812 10/14/23 1945 10/14/23 2250  HGB 10.0*   < > 8.7*   < > 8.4*   < >  --    < > 7.8*  --    < > 8.8* 8.8* 8.2*  --   HCT 32.3*   < > 27.0*   < > 25.1*   < >  --    < > 23.8*  --    < > 26.0* 26.0* 24.0*  --   PLT 272   < > 169  --  142*  --   --   --  108*  --   --   --   --   --   --   APTT >200*   < > 49*  --   --    < > 56*  --   --  59*  --   --   --   --  69*  LABPROT 20.9*  --   --   --  21.2*  --   --   --   --   --   --   --   --   --   --   INR 1.8*  --   --   --  1.8*  --   --   --   --   --   --   --   --   --   --   CREATININE  --    < > 2.25*  --  2.09*  --   --   --   --  1.61*  --   --   --   --   --    < > = values in this interval not displayed.    Estimated Creatinine Clearance: 43.8 mL/min (A) (by C-G formula based on SCr of 1.61 mg/dL (H)).   Medical History: Past Medical History:  Diagnosis Date   CAD (coronary artery disease) 09/03/2015   Inf STEMI 2016 tx with DES to RCA and staged PCI with DES x 2 to LAD // Inf STEMI 8/19 - 2/2 stent thrombosis - LHC:  LAD stents patent, OM1 90, dRCA 100 >> PCI:  Thrombectomy and DES to dRCA   Coronary artery disease    a. Inf STEMI 08/2015 s/p DES to RCA then staged DESx2 to prox and distal LAD;  residual 90% small OM1 which is being treated medically.   Former  tobacco use    Hyperlipemia    Ischemic cardiomyopathy    a. 2D echo 09/02/15: mild LVH, EF 45-50%, mild HK of inferolateral myocardium, grade 1 DD, mild AI/MR.   ST elevation myocardial infarction (STEMI) of inferior wall (HCC) 08/31/2015    Medications:  Scheduled:   sodium chloride   Intravenous Once   aspirin  81 mg Per Tube Daily   Chlorhexidine Gluconate  Cloth  6 each Topical Daily   docusate  100 mg Per Tube BID   fentaNYL  50 mcg Intravenous Once   insulin aspart  2-6 Units Subcutaneous Q4H   insulin detemir  8 Units Subcutaneous Q12H   multivitamin  1 tablet Per Tube QHS   mouth rinse  15 mL Mouth Rinse Q2H   pantoprazole (PROTONIX) IV  40 mg Intravenous QHS   polyethylene glycol  17 g Per Tube Daily   sodium chloride flush  10 mL Intravenous Q12H   sodium chloride flush  3 mL Intravenous Q12H   ticagrelor  90 mg Per Tube BID    Assessment: 57 yom presenting as code STEMI complicated with multiple VF arrests (3 shocks) - now on [place] ECMO. LA 3.4, increasing to >15, now down to 11.4. Received DES to LAD - was on cangrelor prior to transitioning to brilinta. Also received 10,000 units of heparin bolus during procedure.  Anticoagulation was held over night with groin hematoma - improved today s/p femstop - fibrin strands in circuit, -and restarted on low dose bivalirudin with goal aptt 60-65sec.  Hgb INR 1.8, aPTT53sec this am  plt down 100s , fibrinogen 300s, LDH 1300.   PM Update: initial aPTT after restart is low at 59 (up from prior aPTT of 56 before bivalirudin was started).   12/16 PM update:  aPTT just above goal  Goal of Therapy:  Aptt 60-65sec  Monitor platelets by anticoagulation protocol: Yes   Plan:  Dec bivalirudin to 0.01 mg/kg/hr (72.6kg)  Draw aptt in 6hr and q6h for the 1st 24hr  Then aptt/ CBC q12h Monitor s/s bleeidng   Abran Duke, PharmD, BCPS Clinical Pharmacist Phone: [phone]

## 2023-10-14 NOTE — Progress Notes (Signed)
ECMO NOTE:   Indication: Cardiogenic shock/refractory VF   Initial cannulation date: 10/13/23   ECMO type: [facility] ECMO   Dual lumen inflow/return cannula:   1) 25 FR RFV multi-stage venous drainage 2) 19 FR L CFA arterial return 3) R& L SFA distal perfusion catheters 4) R CFA Impella CP vent  Daily data:   VA ECMO  Flow 4.0L RPM 3565 Sweep  4L  Impella CP P-4 with 2.2L flow Waveforms ok Position confirmed under echo   Labs:   ABG    Component Value Date/Time   PHART 7.536 (H) 10/14/2023 0617   PCO2ART 32.8 10/14/2023 0617   PO2ART 286 (H) 10/14/2023 0617   HCO3 27.9 10/14/2023 0617   TCO2 29 10/14/2023 0617   ACIDBASEDEF 2.0 10/14/2023 0118   O2SAT 100 10/14/2023 0617    Plan:  - Continue ECMO support with Impella CP vent - Start low dose bival - Wean inotropes/pressors as able   D/w CCM at bedside  [name], MD  6:53 AM

## 2023-10-14 NOTE — Procedures (Signed)
Admit: 10/13/2023 LOS: 1  Caleb [name] with STEMI, refractory VF/VT, cardiogenic shock  Current CRRT Prescription: Start Date: 10/13/23 Catheter: L Fem Temp HD Cath placed 10/13/23 AHF BFR: 200 Pre Blood Pump: 160mL/h NaHCO3 gtt DFR: 1500 4K Replacement Rate: 151mL/h NaHCHO3 gtt Goal UF: Net even, running positive currently Anticoagulation: systemic per AHF Clotting: None since initiation  S: K 3.2 and P 1.0 K-Phos already ordered Remains on VA ECMO and Impella Amio, NE, Epi, VP, Lidocaine gtt Made UOP 0.6L yest AM ABG 7/54 / 35 / 228  O: 12/15 0701 - 12/16 0700 In: 8036.5 [I.V.:5624.3; NG/GT:120; IV Piggyback:2006.4] Out: 901.8 [Urine:595; Emesis/NG output:250]  Filed Weights   10/13/23 0900  Weight: 72.6 kg    Recent Labs  Lab 10/13/23 1719 10/13/23 1940 10/14/23 0025 10/14/23 0118 10/14/23 0421 10/14/23 0617 10/14/23 0737  NA 140   < > 138   < > 139 139 138  K 2.7*   < > 2.7*   < > 3.2* 3.4* 3.4*  CL 101  --  102  --  101  --   --   CO2 14*  --  21*  --  24  --   --   GLUCOSE 555*  --  320*  --  145*  --   --   BUN 22*  --  18  --  15  --   --   CREATININE 2.60*  --  2.25*  --  2.09*  --   --   CALCIUM 8.2*  --  7.2*  --  7.1*  --   --   PHOS 2.6  --   --   --  1.0*  --   --    < > = values in this interval not displayed.   Recent Labs  Lab 10/13/23 1941 10/13/23 2041 10/14/23 0025 10/14/23 0118 10/14/23 0421 10/14/23 0617 10/14/23 0737  WBC 16.8*  --  6.0  --  4.5  --   --   HGB 9.2*   < > 8.7*   < > 8.4* 7.8* 8.8*  HCT 28.2*   < > 27.0*   < > 25.1* 23.0* 26.0*  MCV 81.7  --  82.1  --  80.2  --   --   PLT 207  --  169  --  142*  --   --    < > = values in this interval not displayed.    Scheduled Meds:  aspirin  81 mg Per Tube Daily   Chlorhexidine Gluconate Cloth  6 each Topical Daily   docusate  100 mg Per Tube BID   fentaNYL  50 mcg Intravenous Once   mouth rinse  15 mL Mouth Rinse Q2H   pantoprazole (PROTONIX) IV  40 mg Intravenous QHS    polyethylene glycol  17 g Per Tube Daily   sodium chloride flush  10 mL Intravenous Q12H   sodium chloride flush  3 mL Intravenous Q12H   ticagrelor  90 mg Per Tube BID   Continuous Infusions:   prismasol BGK 4/2.5 500 mL/hr at 10/14/23 0852    prismasol BGK 4/2.5 400 mL/hr at 10/14/23 0851   sodium chloride 5 mL/hr at 10/14/23 1100   amiodarone 60 mg/hr (10/14/23 1100)   bivalirudin (ANGIOMAX) 250 mg in sodium chloride 0.9 % 500 mL (0.5 mg/mL) infusion     calcium gluconate     epinephrine 11 mcg/min (10/14/23 1100)   fentaNYL infusion INTRAVENOUS 200 mcg/hr (10/14/23 1100)   insulin  1.5 Units/hr (10/14/23 1100)   lidocaine 1 mg/min (10/14/23 1100)   meropenem (MERREM) IV Stopped (10/14/23 1610)   midazolam 6 mg/hr (10/14/23 1100)   norepinephrine (LEVOPHED) Adult infusion 28 mcg/min (10/14/23 1100)   potassium PHOSPHATE IVPB (in mmol) 85 mL/hr at 10/14/23 1100   prismasol BGK 4/2.5 1,500 mL/hr at 10/14/23 9604   sodium bicarbonate 25 mEq (Impella PURGE) in dextrose 5 % 1000 mL bag 200 mL/hr at 10/13/23 1046   vancomycin     vasopressin 0.03 Units/min (10/14/23 1100)   PRN Meds:.sodium chloride, dextrose, fentaNYL, heparin, heparin, midazolam, mouth rinse, sodium chloride flush  ABG    Component Value Date/Time   PHART 7.538 (H) 10/14/2023 0737   PCO2ART 34.7 10/14/2023 0737   PO2ART 228 (H) 10/14/2023 0737   HCO3 29.6 (H) 10/14/2023 0737   TCO2 31 10/14/2023 0737   ACIDBASEDEF 2.0 10/14/2023 0118   O2SAT 100 10/14/2023 0737    A/P  Dialysis dependent AKI on CRRT Start 10/13/23 using L Fem Temp HD cath AKI 2/2 cardiogenic shock as below STEMI, cardiogenic shock, refractory VF/VT Hypokalemia Hypophosphatemia Anemia Metabolic Acidosis; resolved  Change to all 4K prismasate, no further NaHCO3 K Phos already ordered, f/u PM labs Cont net even as hemodynamics permit, AHF assisting with UF Rates  Arita Miss, MD  [facility]

## 2023-10-14 NOTE — Plan of Care (Signed)
  Problem: Safety: Goal: Ability to remain free from injury will improve Outcome: Progressing   Problem: Skin Integrity: Goal: Risk for impaired skin integrity will decrease Outcome: Progressing   Problem: Cardiovascular: Goal: Vascular access site(s) Level 0-1 will be maintained Outcome: Progressing

## 2023-10-14 NOTE — Progress Notes (Signed)
PT Cancellation Note  Patient Details Name: Caleb [name] MRN: [id number] DOB: 1966-02-22   Cancelled Treatment:    Reason Eval/Treat Not Completed: Other (comment). PT order cancelled due to medical instability at this time. Please re-consult PT if needed in future. Thank you.  Lewis Shock, PT, DPT Acute Rehabilitation Services Secure chat preferred Office #: [phone]    Iona Hansen 10/14/2023, 8:45 AM

## 2023-10-14 NOTE — Procedures (Signed)
Arterial Catheter Insertion Procedure Note  Shay Dillow  [id number]  06/02/1966  Date:10/14/23  Time:11:42 AM    Provider Performing: Cheri Fowler    Procedure: Insertion of Arterial Line (78295) with US guidance (62130)   Indication(s) Blood pressure monitoring and/or need for frequent ABGs  Consent Risks of the procedure as well as the alternatives and risks of each were explained to the patient and/or caregiver.  Consent for the procedure was obtained and is signed in the bedside chart  Anesthesia None   Time Out Verified patient identification, verified procedure, site/side was marked, verified correct patient position, special equipment/implants available, medications/allergies/relevant history reviewed, required imaging and test results available.   Sterile Technique Maximal sterile technique including full sterile barrier drape, hand hygiene, sterile gown, sterile gloves, mask, hair covering, sterile ultrasound probe cover (if used).   Procedure Description Area of catheter insertion was cleaned with chlorhexidine and draped in sterile fashion. With real-time ultrasound guidance an arterial catheter was placed into the right  Axillary  artery.  Appropriate arterial tracings confirmed on monitor.     Complications/Tolerance None; patient tolerated the procedure well.   EBL Minimal   Specimen(s) None

## 2023-10-14 NOTE — Progress Notes (Signed)
Arrived to bedside. [place] procedure being done. Tech on standby

## 2023-10-14 NOTE — Procedures (Signed)
Patient Name: Tajohn Talbert  MRN: [id number]  Epilepsy Attending: Charlsie Quest  Referring Physician/Provider: Cheri Fowler, MD  Date: 10/14/2023 Duration: 23.25 mins  Patient history: 57yo M s/p cardiac arrest getting eeg to evaluate for seizure  Level of alertness:  comatose  AEDs during EEG study: Versed  Technical aspects: This EEG study was done with scalp electrodes positioned according to the 10-20 International system of electrode placement. Electrical activity was reviewed with band pass filter of 1-70Hz , sensitivity of 7 uV/mm, display speed of 32mm/sec with a 60Hz  notched filter applied as appropriate. EEG data were recorded continuously and digitally stored.  Video monitoring was available and reviewed as appropriate.  Description: EEG showed continuous generalized 3 to 6 Hz theta-delta slowing admixed with 15 to 18 Hz, 2-3 uV beta activity distributed symmetrically and diffusely. Hyperventilation and photic stimulation were not performed.     ABNORMALITY - Continuous slow, generalized  IMPRESSION: This study is suggestive of severe diffuse encephalopathy likely related to sedation. No seizures or epileptiform discharges were seen throughout the recording.   Christe Tellez Annabelle Harman

## 2023-10-14 NOTE — Progress Notes (Signed)
PHARMACY - ANTICOAGULATION CONSULT NOTE  Pharmacy Consult for bivalirudin  Indication:  ECMO/Impella CP  Allergies  Allergen Reactions   Lisinopril Rash         Patient Measurements: Height: 5\' 1"  (154.9 cm) Weight: 72.6 kg (160 lb) (Estimated weight) IBW/kg (Calculated) : 52.3 Heparin Dosing Weight: 67.5 kg  Vital Signs: Temp: 98.1 F (36.7 C) (12/16 1615) Temp Source: Core (12/16 1600) BP: 83/80 (12/16 0733) Pulse Rate: 80 (12/16 1516)  Labs: Recent Labs    10/13/23 1210 10/13/23 1311 10/13/23 1719 10/13/23 1940 10/14/23 0025 10/14/23 0118 10/14/23 0421 10/14/23 0615 10/14/23 0617 10/14/23 0800 10/14/23 1009 10/14/23 1516 10/14/23 1559 10/14/23 1600 10/14/23 1606  HGB 10.0*   < >  --    < > 8.7*   < > 8.4*  --    < >  --    < > 8.5* 7.8*  --  8.5*  HCT 32.3*   < >  --    < > 27.0*   < > 25.1*  --    < >  --    < > 25.0* 23.8*  --  25.0*  PLT 272   < >  --    < > 169  --  142*  --   --   --   --   --  108*  --   --   APTT >200*  --   --    < > 49*  --   --  53*  --  56*  --   --   --  59*  --   LABPROT 20.9*  --   --   --   --   --  21.2*  --   --   --   --   --   --   --   --   INR 1.8*  --   --   --   --   --  1.8*  --   --   --   --   --   --   --   --   CREATININE  --    < > 2.60*  --  2.25*  --  2.09*  --   --   --   --   --   --   --   --    < > = values in this interval not displayed.    Estimated Creatinine Clearance: 33.7 mL/min (A) (by C-G formula based on SCr of 2.09 mg/dL (H)).   Medical History: Past Medical History:  Diagnosis Date   CAD (coronary artery disease) 09/03/2015   Inf STEMI 2016 tx with DES to RCA and staged PCI with DES x 2 to LAD // Inf STEMI 8/19 - 2/2 stent thrombosis - LHC:  LAD stents patent, OM1 90, dRCA 100 >> PCI:  Thrombectomy and DES to dRCA   Coronary artery disease    a. Inf STEMI 08/2015 s/p DES to RCA then staged DESx2 to prox and distal LAD;  residual 90% small OM1 which is being treated medically.   Former  tobacco use    Hyperlipemia    Ischemic cardiomyopathy    a. 2D echo 09/02/15: mild LVH, EF 45-50%, mild HK of inferolateral myocardium, grade 1 DD, mild AI/MR.   ST elevation myocardial infarction (STEMI) of inferior wall (HCC) 08/31/2015    Medications:  Scheduled:   aspirin  81 mg Per Tube Daily   Chlorhexidine Gluconate Cloth  6 each  Topical Daily   docusate  100 mg Per Tube BID   fentaNYL  50 mcg Intravenous Once   insulin aspart  2-6 Units Subcutaneous Q4H   insulin detemir  8 Units Subcutaneous Q12H   multivitamin  1 tablet Per Tube QHS   mouth rinse  15 mL Mouth Rinse Q2H   pantoprazole (PROTONIX) IV  40 mg Intravenous QHS   polyethylene glycol  17 g Per Tube Daily   sodium chloride flush  10 mL Intravenous Q12H   sodium chloride flush  3 mL Intravenous Q12H   ticagrelor  90 mg Per Tube BID    Assessment: 57 yom presenting as code STEMI complicated with multiple VF arrests (3 shocks) - now on [place] ECMO. LA 3.4, increasing to >15, now down to 11.4. Received DES to LAD - was on cangrelor prior to transitioning to brilinta. Also received 10,000 units of heparin bolus during procedure.  Anticoagulation was held over night with groin hematoma - improved today s/p femstop - fibrin strands in circuit, -and restarted on low dose bivalirudin with goal aptt 60-65sec.  Hgb INR 1.8, aPTT53sec this am  plt down 100s , fibrinogen 300s, LDH 1300.   PM Update: initial aPTT after restart is low at 59 (up from prior aPTT of 56 before bivalirudin was started).   Goal of Therapy:  Aptt 60-65sec  Monitor platelets by anticoagulation protocol: Yes   Plan:  Increase bivalirudin 0.011 mg/kg/hr (72.6kg)  Draw aptt in 6hr and q6h for the 1st 24hr  Then aptt/ CBC q12h Monitor s/s bleeidng   Link Snuffer, PharmD, BCPS, BCCCP Please refer to [facility] for [facility] numbers 10/14/2023 5:02 PM

## 2023-10-14 NOTE — Consult Note (Addendum)
Caleb [name], MRN:  [id number], DOB:  October 10, 1966, LOS: 1 ADMISSION DATE:  10/13/2023, CONSULTATION DATE:  10/13/23 REFERRING MD:  EDP, CHIEF COMPLAINT:  chest pain   History of Present Illness:  57 year old man w/ hx of CAD s/p PCI in 2019 presenting with crushing chest pain.  EKG with STEMI.  Pain was a little abnormal in character so taken for dissection protocol CTA which was negative.  In CT Vfib arrest x 3 with ROSC, brought emergently to cath lab with LUCAS compressions ongoing.  PCCM consulted to assist with management.  Pertinent  Medical History  CAD w/ prior STEMI and PCI Ischemic cardiomyopathy EF 45-50% back in 2016  Significant Hospital Events: Including procedures, antibiotic start and stop dates in addition to other pertinent events   12/15 admit, code, ECMO  Interim History / Subjective:  Remain on multiple vasopressor support including epinephrine, norepinephrine and vasopressin On CRRT due to AKI Lactate trended down from 15-8  Objective   Blood pressure (!) 83/80, pulse 82, temperature 97.9 F (36.6 C), resp. rate 20, height 5\' 1"  (1.549 m), weight 72.6 kg, SpO2 100%. PAP: (21-50)/(14-46) 23/19 CVP:  [12 mmHg-35 mmHg] 13 mmHg  Vent Mode: PCV FiO2 (%):  [60 %-100 %] 60 % Set Rate:  [18 bmp-60 bmp] 20 bmp Vt Set:  [480 mL] 480 mL PEEP:  [5 cmH20-10 cmH20] 10 cmH20 Plateau Pressure:  [19 cmH20-24 cmH20] 24 cmH20   Intake/Output Summary (Last 24 hours) at 10/14/2023 0848 Last data filed at 10/14/2023 0800 Gross per 24 hour  Intake 8258.87 ml  Output 901.8 ml  Net 7357.07 ml   Filed Weights   10/13/23 0900  Weight: 72.6 kg    Examination: General: Crtitically ill-appearing male, orally intubated HEENT: [place]/AT, eyes anicteric.  ETT and OGT in place Neuro: Sedated, not following commands.  Eyes are closed.  Pupils 3 mm bilateral reactive to light Chest: Bilateral crackles at bases, no wheezes Heart: Regular rate and rhythm, no murmurs or  gallops Abdomen: Soft, nondistended, bowel sounds present Skin: No rash Extremities: Left leg is swollen  Na 138, K 3.2, BUN 15, Cr 2.0, serum phosphorus 1, AST 612, ALT 112, LDH 1321 Lactate 8.3 7.53/34/228/100% Hemoglobin 8.8, WBC 4.5, platelet 142 A1c 6.4  Resolved Hospital Problem list   N/A  Assessment & Plan:  Acute STEMI, Vfib arrest on [place] ECMO and Impella at P2 Acute on chronic biventricular heart failure with cardiogenic shock- s/p eCPR 10/13/23 Acute respiratory failure with hypoxia likely due to aspiration pneumonia Hx CAD with prior PCI Acute kidney injury due to ischemic ATN Shock liver Hypokalemia/hypophosphatemia/hypocalcemia/ Lactic acidosis Anemia and thrombocytopenia critical illness Prediabetes with hyperglycemia Obesity  Advanced heart failure team is following Continue VA ECMO Patient has Impella at P2 with flow 1.4 L Bedside echocardiogram showed bilateral dilated heart with significantly reduced function Continue epinephrine, norepinephrine and vasopressin with MAP goal 65 Continue aspirin and Brilinta Continue lung protective ventilation VAP prevention bundle in place Continue IV antibiotic with vancomycin and meropenem PAD protocol with Versed and fentanyl Continue CRRT Will stop bicarbonate on CRRT Trend LFTs Aggressively supplement electrolytes Will get EEG to rule out seizures as patient is having intermittent tremors Trend lactate Monitor H&H and platelet count Hemoglobin A1c 6.4, monitor fingerstick with goal 140-180 Continue sliding scale and long-acting insulin Continue n.p.o. for now due to profound shock Diet and exercise counseling as appropriate  Best Practice (right click and "Reselect all SmartList Selections" daily)   Diet/type: NPO  DVT prophylaxis bival Pressure ulcer(s): N/A GI prophylaxis: PPI Lines: Central line Foley:  Yes, and it is still needed Code Status:  full code Last date of multidisciplinary goals of care  discussion: Per primary team  Labs   CBC: Recent Labs  Lab 10/13/23 1415 10/13/23 1434 10/13/23 1710 10/13/23 1940 10/13/23 1941 10/13/23 2041 10/14/23 0025 10/14/23 0118 10/14/23 0217 10/14/23 0419 10/14/23 0421 10/14/23 0617 10/14/23 0737  WBC 18.5*  --  18.1*  --  16.8*  --  6.0  --   --   --  4.5  --   --   HGB 9.9*   < > 9.2*   < > 9.2*   < > 8.7*   < > 7.8* 8.2* 8.4* 7.8* 8.8*  HCT 32.2*   < > 29.0*   < > 28.2*   < > 27.0*   < > 23.0* 24.0* 25.1* 23.0* 26.0*  MCV 85.4  --  84.1  --  81.7  --  82.1  --   --   --  80.2  --   --   PLT 274  --  207  --  207  --  169  --   --   --  142*  --   --    < > = values in this interval not displayed.    Basic Metabolic Panel: Recent Labs  Lab 10/13/23 0953 10/13/23 1010 10/13/23 1338 10/13/23 1405 10/13/23 1719 10/13/23 1940 10/14/23 0025 10/14/23 0118 10/14/23 0217 10/14/23 0419 10/14/23 0421 10/14/23 0617 10/14/23 0737  NA 141   < > 142   < > 140   < > 138   < > 141 140 139 139 138  K 2.8*   < > 3.1*   < > 2.7*   < > 2.7*   < > 2.9* 3.2* 3.2* 3.4* 3.4*  CL 102  --  99  --  101  --  102  --   --   --  101  --   --   CO2  --   --  17*  --  14*  --  21*  --   --   --  24  --   --   GLUCOSE 315*  --  539*  --  555*  --  320*  --   --   --  145*  --   --   BUN 20  --  19  --  22*  --  18  --   --   --  15  --   --   CREATININE 1.90*  --  2.83*  --  2.60*  --  2.25*  --   --   --  2.09*  --   --   CALCIUM  --   --  6.9*  --  8.2*  --  7.2*  --   --   --  7.1*  --   --   MG  --   --   --   --   --   --   --   --   --   --  2.3  --   --   PHOS  --   --   --   --  2.6  --   --   --   --   --  1.0*  --   --    < > = values in this interval not displayed.   GFR:  Estimated Creatinine Clearance: 33.7 mL/min (A) (by C-G formula based on SCr of 2.09 mg/dL (H)). Recent Labs  Lab 10/13/23 1028 10/13/23 1210 10/13/23 1710 10/13/23 1817 10/13/23 1941 10/14/23 0025 10/14/23 0120 10/14/23 0421 10/14/23 0526  WBC  --    < >  18.1*  --  16.8* 6.0  --  4.5  --   LATICACIDVEN 11.4*  --   --  >15.0*  --   --  10.4*  --  8.3*   < > = values in this interval not displayed.    Liver Function Tests: Recent Labs  Lab 10/13/23 1338 10/14/23 0421  AST 616* 612*  ALT 135* 112*  ALKPHOS 73 47  BILITOT 1.1 1.6*  PROT 3.8* 3.7*  ALBUMIN 1.5* 2.2*  2.2*   No results for input(s): "LIPASE", "AMYLASE" in the last 168 hours. No results for input(s): "AMMONIA" in the last 168 hours.  ABG    Component Value Date/Time   PHART 7.538 (H) 10/14/2023 0737   PCO2ART 34.7 10/14/2023 0737   PO2ART 228 (H) 10/14/2023 0737   HCO3 29.6 (H) 10/14/2023 0737   TCO2 31 10/14/2023 0737   ACIDBASEDEF 2.0 10/14/2023 0118   O2SAT 100 10/14/2023 0737     Coagulation Profile: Recent Labs  Lab 10/13/23 1210 10/14/23 0421  INR 1.8* 1.8*    Cardiac Enzymes: No results for input(s): "CKTOTAL", "CKMB", "CKMBINDEX", "TROPONINI" in the last 168 hours.  HbA1C: Hgb A1c MFr Bld  Date/Time Value Ref Range Status  06/07/2018 01:11 AM 5.8 (H) 4.8 - 5.6 % Final    Comment:    (NOTE) Pre diabetes:          5.7%-6.4% Diabetes:              >6.4% Glycemic control for   <7.0% adults with diabetes   08/31/2015 04:08 PM 5.2 4.8 - 5.6 % Final    Comment:    (NOTE)         Pre-diabetes: 5.7 - 6.4         Diabetes: >6.4         Glycemic control for adults with diabetes: <7.0     CBG: Recent Labs  Lab 10/14/23 0215 10/14/23 0326 10/14/23 0416 10/14/23 0523 10/14/23 0614  GLUCAP 209* 163* 137* 114* 110*    The patient is critically ill due to acute STEMI/cardiac arrest/cardiogenic shock.  Critical care was necessary to treat or prevent imminent or life-threatening deterioration.  Critical care was time spent personally by me on the following activities: development of treatment plan with patient and/or surrogate as well as nursing, discussions with consultants, evaluation of patient's response to treatment, examination of  patient, obtaining history from patient or surrogate, ordering and performing treatments and interventions, ordering and review of laboratory studies, ordering and review of radiographic studies, pulse oximetry, re-evaluation of patient's condition and participation in multidisciplinary rounds.   During this encounter critical care time was devoted to patient care services described in this note for 47 minutes.     Cheri Fowler, MD [facility] See Amion for pager If no response to pager, please call [phone] until 7pm After 7pm, Please call E-link [phone]

## 2023-10-14 NOTE — Progress Notes (Signed)
Advanced Heart Failure Rounding Note  PCP-Cardiologist: Tonny Bollman, MD   Subjective:     Received 2 units of albumin overnight, 3 episodes of shaking, dyssynchrony on the vent, and low flows that improved with increased sedation and rocuronium push.  Hemoglobin has been largely stable, transfuse for target greater than 8.  Impella adjusted under echo guidance, flows increased to P4 with improvement in LVIDD to 5.4 cm.  Family updated at bedside   Objective:   Weight Range: 72.6 kg Body mass index is 30.23 kg/m.   Vital Signs:   Temp:  [97.5 F (36.4 C)-98.4 F (36.9 C)] 98.2 F (36.8 C) (12/16 1957) Pulse Rate:  [0-86] 74 (12/16 1957) Resp:  [10-20] 20 (12/16 1957) BP: (79-83)/(74-80) 79/74 (12/16 1957) SpO2:  [75 %-100 %] 99 % (12/16 1957) FiO2 (%):  [50 %-100 %] 100 % (12/16 1952) Last BM Date : 10/13/23  Weight change: Filed Weights   10/13/23 0900  Weight: 72.6 kg    Intake/Output:   Intake/Output Summary (Last 24 hours) at 10/14/2023 2032 Last data filed at 10/14/2023 2000 Gross per 24 hour  Intake 6311.88 ml  Output 2430.5 ml  Net 3881.38 ml      Physical Exam    General: Ill-appearing, intubated HEENT: NCAT, swollen Cor: Tachycardic, cannulated on peripheral VA ECMO with right femoral vein venous drainage, left femoral arterial return, right common femoral Impella with bilateral distal perfusion catheters Lungs: Ventilated breath sounds Abdomen: soft, nontender, nondistended Extremities: no cyanosis, clubbing, rash Neuro: alert & orientedx3, cranial nerves grossly intact. moves all 4 extremities w/o difficulty. Affect pleasant Vascular: 2+ radial pulses   Patient Profile   Patient is a 57 year old male with a past medical history of coronary artery disease and poor medical follow-up who presented with acute LAD infarct complicated by ventricular fibrillation leading to SCAI E shock requiring VA ECMO placement.  Assessment/Plan    Refractory VT/VF arrest in setting of acute MI 12/15: defibrillated > 30 times, currently on amiodarone and lidocaine, lidocaine level 3.4.  Will hopefully wean off tomorrow. - continue amio/lido for now.  - continue ECMO support - Keep K> 4 MG > 2   Acute systolic HF with Cardiogenic shock, SCAI E:  ECPR with VA ECMO cannulation 12/15, Impella vent. Continue epi, NE. Wean as tolerated. Was able to increase impella flow with improvement in LVIDD, but given worsening volume overload have had to back off.  - Bedside echo 12/14 EF 10% RV severely HK - see ECMO circuit note   CAD with acute anterior MI (NSTEMI) - h/o LAD and RCA stents - s/p PCI/DES to occluded LAD on 12/15. RCA CTO - Continue DAPT/statin   Acute hypoxic respiratory failure - due to cardiac arrest/pulmonary edema - on vent and VA ECMO - CCM co-managing - Volume removal with CRRT - Lasix 120mg  IV x1   Acute blood loss anemia with L groin hematoma - pressure held. Fem stop in place - transfuse to keep Hgb > 8 g/dl   AKI - due to ATN - HD cath in place - Nephrology consulted - Continue CRRT   Shock liver - supportive care   DM2 - insulin gtt   Noncompliance - has not followed up after previous MIs   Length of Stay: 1  [name], MD  10/14/2023, 8:32 PM  Advanced Heart Failure Team Pager [phone] (M-F; 7a - 5p)  Please contact [facility] for night-coverage after hours (5p -7a ) and weekends on [email or web address]  CRITICAL CARE Performed by: [name]   Total critical care time: 70 minutes  Critical care time was exclusive of separately billable procedures and treating other patients.  Critical care was necessary to treat or prevent imminent or life-threatening deterioration.  Critical care was time spent personally by me on the following activities: development of treatment plan with patient and/or surrogate as well as nursing, discussions with consultants, evaluation of patient's  response to treatment, examination of patient, obtaining history from patient or surrogate, ordering and performing treatments and interventions, ordering and review of laboratory studies, ordering and review of radiographic studies, pulse oximetry and re-evaluation of patient's condition.

## 2023-10-14 NOTE — Progress Notes (Signed)
Under echocardiographic guidance impella CP was withdrawn slowly in an attempt to free from the mitral valve apparatus. Mild improvement in flows following advancement, final position noted to be 4.3cm from the aortic annulus with 2.3L/min of flow at P4.

## 2023-10-14 NOTE — Progress Notes (Signed)
EEG complete - results pending 

## 2023-10-14 NOTE — Progress Notes (Signed)
   Patient Name: Caleb [name] Date of Encounter: 10/14/2023 [facility] Cardiologist: Tonny Bollman, MD   Interval Summary  .    S/p PCI LAD due to late stent thrombosis + VA ECMO 2/2 VT/VF arrest  Epi 33/vaso 9/NE 28/insulin/lido/amio  VA flows 3L; Impella P2; CVVHD  CVP 17, MAP 70-80s, PAP 22/19  Intubated/sedated  Left leg swollen  Vital Signs .    Vitals:   10/14/23 0704 10/14/23 0709 10/14/23 0715 10/14/23 0733  BP:    (!) 83/80  Pulse: (!) 0 (!) 0  82  Resp:   20 19  Temp:   98.1 F (36.7 C)   TempSrc:      SpO2:      Weight:      Height:        Intake/Output Summary (Last 24 hours) at 10/14/2023 0801 Last data filed at 10/14/2023 0700 Gross per 24 hour  Intake 8036.47 ml  Output 901.8 ml  Net 7134.67 ml      10/13/2023    9:00 AM 06/07/2018    5:06 PM 06/06/2018    5:37 AM  Last 3 Weights  Weight (lbs) 160 lb 142 lb 1.6 oz 156 lb  Weight (kg) 72.576 kg 64.456 kg 70.761 kg      Telemetry/ECG    NSR, no sustained VT - Personally Reviewed  Physical Exam .   GEN: Intubated, sedated Neck: No JVD; RIJ swan Cardiac: RRR, no murmurs, rubs, or gallops. +Impella hum Respiratory: Clear to auscultation bilaterally anteriorly GI: Soft, nontender, non-distended, very sparse BS MS:   RFA Impella, RFV 25Fr, LFA 19Fr, R radial art line; left leg tight/swollen  Assessment & Plan .      STEMI:  Has a history of multiple STEMIs, noncompliance, and poor medical follow up.  Continue ticagrelor and ASA per tube (no high residuals per nursing). VT/VF:  Continue amio/lido; has been quiescent Cardiogenic shock:  On multiple pressors, ECMO, Impella (P2 to vent).  Continue management per AHF.  Due to noncompliance, not a candidate for advanced therapies.  Goal currently is recovery Respiratory failure:  Per PCCM AKI:  Elevated bicarbonate on ABG; plan oto d/c bicarb gtt.    For questions or updates, please contact [facility] Please consult  [email or web address] for contact info under    CRITICAL CARE Performed by: Orbie Pyo   Total critical care time: 30 minutes  Critical care time was exclusive of separately billable procedures and treating other patients.  Critical care was necessary to treat or prevent imminent or life-threatening deterioration.  Critical care was time spent personally by me on the following activities: development of treatment plan with patient and/or surrogate as well as nursing, discussions with consultants, evaluation of patient's response to treatment, examination of patient, obtaining history from patient or surrogate, ordering and performing treatments and interventions, ordering and review of laboratory studies, ordering and review of radiographic studies, pulse oximetry and re-evaluation of patient's condition.     Signed, Orbie Pyo, MD

## 2023-10-14 NOTE — Progress Notes (Signed)
PHARMACY - ANTICOAGULATION CONSULT NOTE  Pharmacy Consult for bivalirudin  Indication:  ECMO/Impella CP  Allergies  Allergen Reactions   Lisinopril Rash         Patient Measurements: Height: 5\' 1"  (154.9 cm) Weight: 72.6 kg (160 lb) (Estimated weight) IBW/kg (Calculated) : 52.3 Heparin Dosing Weight: 67.5 kg  Vital Signs: Temp: 97.5 F (36.4 C) (12/16 1115) Temp Source: Core (12/16 0800) BP: 83/80 (12/16 0733) Pulse Rate: 83 (12/16 0906)  Labs: Recent Labs    10/13/23 1210 10/13/23 1311 10/13/23 1719 10/13/23 1940 10/13/23 1941 10/13/23 2041 10/14/23 0025 10/14/23 0118 10/14/23 0421 10/14/23 0615 10/14/23 0617 10/14/23 0737 10/14/23 0800  HGB 10.0*   < >  --    < > 9.2*   < > 8.7*   < > 8.4*  --  7.8* 8.8*  --   HCT 32.3*   < >  --    < > 28.2*   < > 27.0*   < > 25.1*  --  23.0* 26.0*  --   PLT 272   < >  --   --  207  --  169  --  142*  --   --   --   --   APTT >200*  --   --   --  41*  --  49*  --   --  53*  --   --  56*  LABPROT 20.9*  --   --   --   --   --   --   --  21.2*  --   --   --   --   INR 1.8*  --   --   --   --   --   --   --  1.8*  --   --   --   --   CREATININE  --    < > 2.60*  --   --   --  2.25*  --  2.09*  --   --   --   --    < > = values in this interval not displayed.    Estimated Creatinine Clearance: 33.7 mL/min (A) (by C-G formula based on SCr of 2.09 mg/dL (H)).   Medical History: Past Medical History:  Diagnosis Date   CAD (coronary artery disease) 09/03/2015   Inf STEMI 2016 tx with DES to RCA and staged PCI with DES x 2 to LAD // Inf STEMI 8/19 - 2/2 stent thrombosis - LHC:  LAD stents patent, OM1 90, dRCA 100 >> PCI:  Thrombectomy and DES to dRCA   Coronary artery disease    a. Inf STEMI 08/2015 s/p DES to RCA then staged DESx2 to prox and distal LAD;  residual 90% small OM1 which is being treated medically.   Former tobacco use    Hyperlipemia    Ischemic cardiomyopathy    a. 2D echo 09/02/15: mild LVH, EF 45-50%, mild HK  of inferolateral myocardium, grade 1 DD, mild AI/MR.   ST elevation myocardial infarction (STEMI) of inferior wall (HCC) 08/31/2015    Medications:  Scheduled:   aspirin  81 mg Per Tube Daily   Chlorhexidine Gluconate Cloth  6 each Topical Daily   docusate  100 mg Per Tube BID   fentaNYL  50 mcg Intravenous Once   mouth rinse  15 mL Mouth Rinse Q2H   pantoprazole (PROTONIX) IV  40 mg Intravenous QHS   polyethylene glycol  17 g Per Tube Daily  sodium chloride flush  10 mL Intravenous Q12H   sodium chloride flush  3 mL Intravenous Q12H   ticagrelor  90 mg Per Tube BID    Assessment: 57 yom presenting as code STEMI complicated with multiple VF arrests (3 shocks) - now on [place] ECMO. LA 3.4, increasing to >15, now down to 11.4. Received DES to LAD - was on cangrelor prior to transitioning to brilinta. Also received 10,000 units of heparin bolus during procedure.  Anticoagulation held over night with groin hematoma - improved today s/p femstop - fibrin strands in circuit, - will begin low dose bivalirudin with goal aptt 60-65sec  Hgb INR 1.8, aPTT53sec this am  plt down 100s , fibrinogen 300s, LDH 1300.   Goal of Therapy:  Aptt 60-65sec  Monitor platelets by anticoagulation protocol: Yes   Plan:  Begin bivalirudin 0.01mg /kg/hr (72.6kg)  Draw aptt 6hr after initiation and q6h for the 1st 24hr  Then aptt/ CBC q12h Monitor s/s bleeidng    Leota Sauers Pharm.D. CPP, BCPS Clinical Pharmacist [phone] 10/14/2023 11:39 AM    Please check AMION for all [facility] phone numbers After 10:00 PM, call Main Pharmacy [phone]

## 2023-10-15 ENCOUNTER — Encounter (HOSPITAL_COMMUNITY): Admission: EM | Disposition: E | Payer: Self-pay | Source: Home / Self Care | Attending: Cardiology

## 2023-10-15 ENCOUNTER — Inpatient Hospital Stay (HOSPITAL_COMMUNITY): Payer: Commercial Managed Care - PPO

## 2023-10-15 DIAGNOSIS — I213 ST elevation (STEMI) myocardial infarction of unspecified site: Secondary | ICD-10-CM | POA: Diagnosis not present

## 2023-10-15 DIAGNOSIS — I249 Acute ischemic heart disease, unspecified: Secondary | ICD-10-CM | POA: Diagnosis not present

## 2023-10-15 DIAGNOSIS — Z9281 Personal history of extracorporeal membrane oxygenation (ECMO): Secondary | ICD-10-CM

## 2023-10-15 DIAGNOSIS — R570 Cardiogenic shock: Secondary | ICD-10-CM | POA: Diagnosis not present

## 2023-10-15 DIAGNOSIS — I469 Cardiac arrest, cause unspecified: Secondary | ICD-10-CM | POA: Diagnosis not present

## 2023-10-15 DIAGNOSIS — J9600 Acute respiratory failure, unspecified whether with hypoxia or hypercapnia: Secondary | ICD-10-CM

## 2023-10-15 DIAGNOSIS — I5021 Acute systolic (congestive) heart failure: Secondary | ICD-10-CM | POA: Diagnosis not present

## 2023-10-15 DIAGNOSIS — I4901 Ventricular fibrillation: Secondary | ICD-10-CM | POA: Diagnosis not present

## 2023-10-15 HISTORY — PX: TEMPORARY PACEMAKER: CATH118268

## 2023-10-15 LAB — POCT I-STAT 7, (LYTES, BLD GAS, ICA,H+H)
Acid-Base Excess: 0 mmol/L (ref 0.0–2.0)
Acid-Base Excess: 1 mmol/L (ref 0.0–2.0)
Acid-Base Excess: 1 mmol/L (ref 0.0–2.0)
Acid-Base Excess: 1 mmol/L (ref 0.0–2.0)
Acid-Base Excess: 8 mmol/L — ABNORMAL HIGH (ref 0.0–2.0)
Acid-base deficit: 3 mmol/L — ABNORMAL HIGH (ref 0.0–2.0)
Acid-base deficit: 5 mmol/L — ABNORMAL HIGH (ref 0.0–2.0)
Acid-base deficit: 2 mmol/L (ref 0.0–2.0)
Acid-base deficit: 5 mmol/L — ABNORMAL HIGH (ref 0.0–2.0)
Acid-base deficit: 3 mmol/L — ABNORMAL HIGH (ref 0.0–2.0)
Bicarbonate: 17.7 mmol/L — ABNORMAL LOW (ref 20.0–28.0)
Bicarbonate: 19.7 mmol/L — ABNORMAL LOW (ref 20.0–28.0)
Bicarbonate: 20.2 mmol/L (ref 20.0–28.0)
Bicarbonate: 22.1 mmol/L (ref 20.0–28.0)
Bicarbonate: 22.6 mmol/L (ref 20.0–28.0)
Bicarbonate: 22.9 mmol/L (ref 20.0–28.0)
Bicarbonate: 23.5 mmol/L (ref 20.0–28.0)
Bicarbonate: 24.8 mmol/L (ref 20.0–28.0)
Bicarbonate: 25.2 mmol/L (ref 20.0–28.0)
Bicarbonate: 26.2 mmol/L (ref 20.0–28.0)
Calcium, Ion: 0.87 mmol/L — CL (ref 1.15–1.40)
Calcium, Ion: 0.88 mmol/L — CL (ref 1.15–1.40)
Calcium, Ion: 0.92 mmol/L — ABNORMAL LOW (ref 1.15–1.40)
Calcium, Ion: 0.94 mmol/L — ABNORMAL LOW (ref 1.15–1.40)
Calcium, Ion: 0.95 mmol/L — ABNORMAL LOW (ref 1.15–1.40)
Calcium, Ion: 0.95 mmol/L — ABNORMAL LOW (ref 1.15–1.40)
Calcium, Ion: 0.95 mmol/L — ABNORMAL LOW (ref 1.15–1.40)
Calcium, Ion: 0.96 mmol/L — ABNORMAL LOW (ref 1.15–1.40)
Calcium, Ion: 0.97 mmol/L — ABNORMAL LOW (ref 1.15–1.40)
Calcium, Ion: 1.1 mmol/L — ABNORMAL LOW (ref 1.15–1.40)
HCT: 20 % — ABNORMAL LOW (ref 39.0–52.0)
HCT: 22 % — ABNORMAL LOW (ref 39.0–52.0)
HCT: 22 % — ABNORMAL LOW (ref 39.0–52.0)
HCT: 22 % — ABNORMAL LOW (ref 39.0–52.0)
HCT: 22 % — ABNORMAL LOW (ref 39.0–52.0)
HCT: 25 % — ABNORMAL LOW (ref 39.0–52.0)
HCT: 25 % — ABNORMAL LOW (ref 39.0–52.0)
HCT: 26 % — ABNORMAL LOW (ref 39.0–52.0)
HCT: 26 % — ABNORMAL LOW (ref 39.0–52.0)
HCT: 39 % (ref 39.0–52.0)
Hemoglobin: 13.3 g/dL (ref 13.0–17.0)
Hemoglobin: 6.8 g/dL — CL (ref 13.0–17.0)
Hemoglobin: 7.5 g/dL — ABNORMAL LOW (ref 13.0–17.0)
Hemoglobin: 7.5 g/dL — ABNORMAL LOW (ref 13.0–17.0)
Hemoglobin: 7.5 g/dL — ABNORMAL LOW (ref 13.0–17.0)
Hemoglobin: 7.5 g/dL — ABNORMAL LOW (ref 13.0–17.0)
Hemoglobin: 8.5 g/dL — ABNORMAL LOW (ref 13.0–17.0)
Hemoglobin: 8.5 g/dL — ABNORMAL LOW (ref 13.0–17.0)
Hemoglobin: 8.8 g/dL — ABNORMAL LOW (ref 13.0–17.0)
Hemoglobin: 8.8 g/dL — ABNORMAL LOW (ref 13.0–17.0)
O2 Saturation: 100 %
O2 Saturation: 100 %
O2 Saturation: 100 %
O2 Saturation: 100 %
O2 Saturation: 100 %
O2 Saturation: 100 %
O2 Saturation: 95 %
O2 Saturation: 97 %
O2 Saturation: 99 %
O2 Saturation: 99 %
Patient temperature: 36.4
Patient temperature: 36.4
Patient temperature: 36.4
Patient temperature: 36.7
Patient temperature: 36.7
Patient temperature: 36.8
Patient temperature: 36.8
Patient temperature: 36.8
Patient temperature: 36.8
Patient temperature: 36.8
Potassium: 4.6 mmol/L (ref 3.5–5.1)
Potassium: 5.1 mmol/L (ref 3.5–5.1)
Potassium: 5.5 mmol/L — ABNORMAL HIGH (ref 3.5–5.1)
Potassium: 5.5 mmol/L — ABNORMAL HIGH (ref 3.5–5.1)
Potassium: 5.6 mmol/L — ABNORMAL HIGH (ref 3.5–5.1)
Potassium: 5.6 mmol/L — ABNORMAL HIGH (ref 3.5–5.1)
Potassium: 5.7 mmol/L — ABNORMAL HIGH (ref 3.5–5.1)
Potassium: 5.7 mmol/L — ABNORMAL HIGH (ref 3.5–5.1)
Potassium: 5.7 mmol/L — ABNORMAL HIGH (ref 3.5–5.1)
Potassium: 5.7 mmol/L — ABNORMAL HIGH (ref 3.5–5.1)
Sodium: 131 mmol/L — ABNORMAL LOW (ref 135–145)
Sodium: 132 mmol/L — ABNORMAL LOW (ref 135–145)
Sodium: 132 mmol/L — ABNORMAL LOW (ref 135–145)
Sodium: 133 mmol/L — ABNORMAL LOW (ref 135–145)
Sodium: 133 mmol/L — ABNORMAL LOW (ref 135–145)
Sodium: 133 mmol/L — ABNORMAL LOW (ref 135–145)
Sodium: 133 mmol/L — ABNORMAL LOW (ref 135–145)
Sodium: 134 mmol/L — ABNORMAL LOW (ref 135–145)
Sodium: 135 mmol/L (ref 135–145)
Sodium: 137 mmol/L (ref 135–145)
TCO2: 19 mmol/L — ABNORMAL LOW (ref 22–32)
TCO2: 21 mmol/L — ABNORMAL LOW (ref 22–32)
TCO2: 21 mmol/L — ABNORMAL LOW (ref 22–32)
TCO2: 23 mmol/L (ref 22–32)
TCO2: 24 mmol/L (ref 22–32)
TCO2: 24 mmol/L (ref 22–32)
TCO2: 25 mmol/L (ref 22–32)
TCO2: 26 mmol/L (ref 22–32)
TCO2: 26 mmol/L (ref 22–32)
TCO2: 28 mmol/L (ref 22–32)
pCO2 arterial: 33.3 mm[Hg] (ref 32–48)
pCO2 arterial: 34 mm[Hg] (ref 32–48)
pCO2 arterial: 34.3 mm[Hg] (ref 32–48)
pCO2 arterial: 34.4 mm[Hg] (ref 32–48)
pCO2 arterial: 35.4 mm[Hg] (ref 32–48)
pCO2 arterial: 35.5 mm[Hg] (ref 32–48)
pCO2 arterial: 38.5 mm[Hg] (ref 32–48)
pCO2 arterial: 38.7 mm[Hg] (ref 32–48)
pCO2 arterial: 43 mm[Hg] (ref 32–48)
pCO2 arterial: 43.3 mm[Hg] (ref 32–48)
pH, Arterial: 7.331 — ABNORMAL LOW (ref 7.35–7.45)
pH, Arterial: 7.331 — ABNORMAL LOW (ref 7.35–7.45)
pH, Arterial: 7.365 (ref 7.35–7.45)
pH, Arterial: 7.366 (ref 7.35–7.45)
pH, Arterial: 7.379 (ref 7.35–7.45)
pH, Arterial: 7.389 (ref 7.35–7.45)
pH, Arterial: 7.41 (ref 7.35–7.45)
pH, Arterial: 7.421 (ref 7.35–7.45)
pH, Arterial: 7.442 (ref 7.35–7.45)
pH, Arterial: 7.454 — ABNORMAL HIGH (ref 7.35–7.45)
pO2, Arterial: 133 mm[Hg] — ABNORMAL HIGH (ref 83–108)
pO2, Arterial: 144 mm[Hg] — ABNORMAL HIGH (ref 83–108)
pO2, Arterial: 160 mm[Hg] — ABNORMAL HIGH (ref 83–108)
pO2, Arterial: 226 mm[Hg] — ABNORMAL HIGH (ref 83–108)
pO2, Arterial: 297 mm[Hg] — ABNORMAL HIGH (ref 83–108)
pO2, Arterial: 304 mm[Hg] — ABNORMAL HIGH (ref 83–108)
pO2, Arterial: 468 mm[Hg] — ABNORMAL HIGH (ref 83–108)
pO2, Arterial: 539 mm[Hg] — ABNORMAL HIGH (ref 83–108)
pO2, Arterial: 79 mm[Hg] — ABNORMAL LOW (ref 83–108)
pO2, Arterial: 91 mm[Hg] (ref 83–108)

## 2023-10-15 LAB — POCT I-STAT, CHEM 8
BUN: 9 mg/dL (ref 6–20)
Calcium, Ion: 0.97 mmol/L — ABNORMAL LOW (ref 1.15–1.40)
Chloride: 97 mmol/L — ABNORMAL LOW (ref 98–111)
Creatinine, Ser: 1.7 mg/dL — ABNORMAL HIGH (ref 0.61–1.24)
Glucose, Bld: 110 mg/dL — ABNORMAL HIGH (ref 70–99)
HCT: 25 % — ABNORMAL LOW (ref 39.0–52.0)
Hemoglobin: 8.5 g/dL — ABNORMAL LOW (ref 13.0–17.0)
Potassium: 5.7 mmol/L — ABNORMAL HIGH (ref 3.5–5.1)
Sodium: 133 mmol/L — ABNORMAL LOW (ref 135–145)
TCO2: 24 mmol/L (ref 22–32)

## 2023-10-15 LAB — PREPARE RBC (CROSSMATCH)

## 2023-10-15 LAB — CBC
HCT: 27.2 % — ABNORMAL LOW (ref 39.0–52.0)
HCT: 25.2 % — ABNORMAL LOW (ref 39.0–52.0)
HCT: 25.2 % — ABNORMAL LOW (ref 39.0–52.0)
Hemoglobin: 8.9 g/dL — ABNORMAL LOW (ref 13.0–17.0)
Hemoglobin: 8.1 g/dL — ABNORMAL LOW (ref 13.0–17.0)
Hemoglobin: 8.2 g/dL — ABNORMAL LOW (ref 13.0–17.0)
MCH: 26.3 pg (ref 26.0–34.0)
MCH: 26.6 pg (ref 26.0–34.0)
MCH: 26.8 pg (ref 26.0–34.0)
MCHC: 32.7 g/dL (ref 30.0–36.0)
MCHC: 32.1 g/dL (ref 30.0–36.0)
MCHC: 32.5 g/dL (ref 30.0–36.0)
MCV: 80.5 fL (ref 80.0–100.0)
MCV: 82.9 fL (ref 80.0–100.0)
MCV: 82.4 fL (ref 80.0–100.0)
Platelets: 106 10*3/uL — ABNORMAL LOW (ref 150–400)
Platelets: 82 10*3/uL — ABNORMAL LOW (ref 150–400)
Platelets: 75 10*3/uL — ABNORMAL LOW (ref 150–400)
RBC: 3.38 MIL/uL — ABNORMAL LOW (ref 4.22–5.81)
RBC: 3.04 MIL/uL — ABNORMAL LOW (ref 4.22–5.81)
RBC: 3.06 MIL/uL — ABNORMAL LOW (ref 4.22–5.81)
RDW: 14.5 % (ref 11.5–15.5)
RDW: 14.8 % (ref 11.5–15.5)
RDW: 14.8 % (ref 11.5–15.5)
WBC: 10.4 10*3/uL (ref 4.0–10.5)
WBC: 7.3 10*3/uL (ref 4.0–10.5)
WBC: 5.9 10*3/uL (ref 4.0–10.5)
nRBC: 0.7 % — ABNORMAL HIGH (ref 0.0–0.2)
nRBC: 1.8 % — ABNORMAL HIGH (ref 0.0–0.2)
nRBC: 2.9 % — ABNORMAL HIGH (ref 0.0–0.2)

## 2023-10-15 LAB — HEPATIC FUNCTION PANEL
ALT: 217 U/L — ABNORMAL HIGH (ref 0–44)
AST: 579 U/L — ABNORMAL HIGH (ref 15–41)
Albumin: 2.2 g/dL — ABNORMAL LOW (ref 3.5–5.0)
Alkaline Phosphatase: 66 U/L (ref 38–126)
Bilirubin, Direct: 2.1 mg/dL — ABNORMAL HIGH (ref 0.0–0.2)
Indirect Bilirubin: 3.1 mg/dL — ABNORMAL HIGH (ref 0.3–0.9)
Total Bilirubin: 5.2 mg/dL — ABNORMAL HIGH (ref <1.2)
Total Protein: 4 g/dL — ABNORMAL LOW (ref 6.5–8.1)

## 2023-10-15 LAB — CG4 I-STAT (LACTIC ACID)
Lactic Acid, Venous: 4.7 mmol/L (ref 0.5–1.9)
Lactic Acid, Venous: 6.5 mmol/L (ref 0.5–1.9)
Lactic Acid, Venous: 10.2 mmol/L (ref 0.5–1.9)
Lactic Acid, Venous: 9.3 mmol/L (ref 0.5–1.9)
Lactic Acid, Venous: 9.3 mmol/L (ref 0.5–1.9)
Lactic Acid, Venous: 10.6 mmol/L (ref 0.5–1.9)

## 2023-10-15 LAB — RENAL FUNCTION PANEL
Albumin: 2.2 g/dL — ABNORMAL LOW (ref 3.5–5.0)
Albumin: 1.9 g/dL — ABNORMAL LOW (ref 3.5–5.0)
Anion gap: 10 (ref 5–15)
Anion gap: 14 (ref 5–15)
BUN: 10 mg/dL (ref 6–20)
BUN: 11 mg/dL (ref 6–20)
CO2: 22 mmol/L (ref 22–32)
CO2: 19 mmol/L — ABNORMAL LOW (ref 22–32)
Calcium: 7 mg/dL — ABNORMAL LOW (ref 8.9–10.3)
Calcium: 7.1 mg/dL — ABNORMAL LOW (ref 8.9–10.3)
Chloride: 101 mmol/L (ref 98–111)
Chloride: 100 mmol/L (ref 98–111)
Creatinine, Ser: 1.74 mg/dL — ABNORMAL HIGH (ref 0.61–1.24)
Creatinine, Ser: 2.08 mg/dL — ABNORMAL HIGH (ref 0.61–1.24)
GFR, Estimated: 45 mL/min — ABNORMAL LOW (ref >60)
GFR, Estimated: 37 mL/min — ABNORMAL LOW (ref >60)
Glucose, Bld: 108 mg/dL — ABNORMAL HIGH (ref 70–99)
Glucose, Bld: 77 mg/dL (ref 70–99)
Phosphorus: 4.8 mg/dL — ABNORMAL HIGH (ref 2.5–4.6)
Phosphorus: 7.3 mg/dL — ABNORMAL HIGH (ref 2.5–4.6)
Potassium: 5.8 mmol/L — ABNORMAL HIGH (ref 3.5–5.1)
Potassium: 5.7 mmol/L — ABNORMAL HIGH (ref 3.5–5.1)
Sodium: 133 mmol/L — ABNORMAL LOW (ref 135–145)
Sodium: 133 mmol/L — ABNORMAL LOW (ref 135–145)

## 2023-10-15 LAB — GLUCOSE, CAPILLARY
Glucose-Capillary: 127 mg/dL — ABNORMAL HIGH (ref 70–99)
Glucose-Capillary: 113 mg/dL — ABNORMAL HIGH (ref 70–99)
Glucose-Capillary: 79 mg/dL (ref 70–99)
Glucose-Capillary: 82 mg/dL (ref 70–99)
Glucose-Capillary: 75 mg/dL (ref 70–99)
Glucose-Capillary: 71 mg/dL (ref 70–99)
Glucose-Capillary: 134 mg/dL — ABNORMAL HIGH (ref 70–99)
Glucose-Capillary: 63 mg/dL — ABNORMAL LOW (ref 70–99)

## 2023-10-15 LAB — PROTIME-INR
INR: 2.5 — ABNORMAL HIGH (ref 0.8–1.2)
Prothrombin Time: 27.1 s — ABNORMAL HIGH (ref 11.4–15.2)

## 2023-10-15 LAB — POCT ACTIVATED CLOTTING TIME: Activated Clotting Time: 0 s

## 2023-10-15 LAB — FIBRINOGEN: Fibrinogen: 381 mg/dL (ref 210–475)

## 2023-10-15 LAB — APTT
aPTT: 36 s (ref 24–36)
aPTT: 73 s — ABNORMAL HIGH (ref 24–36)
aPTT: 71 s — ABNORMAL HIGH (ref 24–36)

## 2023-10-15 LAB — HEMOGLOBIN AND HEMATOCRIT, BLOOD
HCT: 23.9 % — ABNORMAL LOW (ref 39.0–52.0)
Hemoglobin: 7.6 g/dL — ABNORMAL LOW (ref 13.0–17.0)

## 2023-10-15 LAB — MAGNESIUM: Magnesium: 2.2 mg/dL (ref 1.7–2.4)

## 2023-10-15 LAB — LACTATE DEHYDROGENASE: LDH: 1908 U/L — ABNORMAL HIGH (ref 98–192)

## 2023-10-15 SURGERY — TEMPORARY PACEMAKER
Anesthesia: LOCAL

## 2023-10-15 MED ORDER — SODIUM CHLORIDE 0.9% IV SOLUTION: Freq: Once | INTRAVENOUS | Status: AC

## 2023-10-15 MED ORDER — SODIUM BICARBONATE 8.4 % IV SOLN
INTRAVENOUS | Status: AC
  Filled 2023-10-15: qty 50

## 2023-10-15 MED ORDER — AMIODARONE LOAD VIA INFUSION
150.0000 mg | Freq: Once | INTRAVENOUS | Status: AC
  Administered 2023-10-15: 150 mg via INTRAVENOUS
  Filled 2023-10-15: qty 83.34

## 2023-10-15 MED ORDER — CALCIUM CHLORIDE 10 % IV SOLN
INTRAVENOUS | Status: AC
  Filled 2023-10-15: qty 20

## 2023-10-15 MED ORDER — ALBUMIN HUMAN 5 % IV SOLN
12.5000 g | INTRAVENOUS | Status: DC | PRN
  Administered 2023-10-15 – 2023-10-16 (×3): 12.5 g via INTRAVENOUS
  Filled 2023-10-15: qty 250

## 2023-10-15 MED ORDER — CALCIUM GLUCONATE-NACL 1-0.675 GM/50ML-% IV SOLN
INTRAVENOUS | Status: AC
  Filled 2023-10-15: qty 100

## 2023-10-15 MED ORDER — ALBUMIN HUMAN 5 % IV SOLN
INTRAVENOUS | Status: AC
  Filled 2023-10-15: qty 500

## 2023-10-15 MED ORDER — LIDOCAINE HCL (PF) 1 % IJ SOLN
INTRAMUSCULAR | Status: AC
  Filled 2023-10-15: qty 30

## 2023-10-15 MED ORDER — BUSPIRONE HCL 10 MG PO TABS
15.0000 mg | ORAL_TABLET | Freq: Three times a day (TID) | ORAL | Status: DC
  Administered 2023-10-15 (×3): 15 mg
  Filled 2023-10-15 (×3): qty 2

## 2023-10-15 MED ORDER — PRISMASOL BGK 0/2.5 32-2.5 MEQ/L EC SOLN
Status: DC
  Filled 2023-10-15 (×4): qty 5000

## 2023-10-15 MED ORDER — SODIUM BICARBONATE 8.4 % IV SOLN
100.0000 meq | Freq: Once | INTRAVENOUS | Status: AC
  Administered 2023-10-15: 100 meq via INTRAVENOUS

## 2023-10-15 MED ORDER — SODIUM BICARBONATE 8.4 % IV SOLN
100.0000 meq | Freq: Once | INTRAVENOUS | Status: AC
Start: 2023-10-15 — End: 2023-10-15

## 2023-10-15 MED ORDER — CALCIUM GLUCONATE 10 % IV SOLN
2.0000 g | Freq: Once | INTRAVENOUS | Status: DC
  Filled 2023-10-15: qty 20

## 2023-10-15 MED ORDER — SODIUM BICARBONATE 8.4 % IV SOLN
INTRAVENOUS | Status: AC
  Administered 2023-10-15: 100 meq via INTRAVENOUS
  Filled 2023-10-15: qty 50

## 2023-10-15 MED ORDER — SODIUM BICARBONATE 8.4 % IV SOLN
INTRAVENOUS | Status: DC
  Filled 2023-10-15 (×2): qty 150

## 2023-10-15 MED ORDER — SODIUM CHLORIDE 0.9 % IV SOLN
0.0115 mg/kg/h | INTRAVENOUS | Status: DC
  Administered 2023-10-15: 0.0115 mg/kg/h via INTRAVENOUS
  Filled 2023-10-15: qty 250

## 2023-10-15 MED ORDER — SODIUM BICARBONATE 8.4 % IV SOLN
INTRAVENOUS | Status: AC
  Filled 2023-10-15: qty 100

## 2023-10-15 MED ORDER — FUROSEMIDE 10 MG/ML IJ SOLN
120.0000 mg | Freq: Once | INTRAMUSCULAR | Status: AC
  Administered 2023-10-15: 120 mg via INTRAVENOUS
  Filled 2023-10-15: qty 10

## 2023-10-15 MED ORDER — LIDOCAINE HCL (PF) 1 % IJ SOLN
INTRAMUSCULAR | Status: DC | PRN
  Administered 2023-10-15: 5 mL

## 2023-10-15 MED ORDER — CALCIUM CHLORIDE 10 % IV SOLN
2.0000 g | Freq: Once | INTRAVENOUS | Status: AC
  Administered 2023-10-15: 2 g via INTRAVENOUS

## 2023-10-15 MED ORDER — CALCIUM CHLORIDE 10 % IV SOLN: 1.0000 g | Freq: Once | INTRAVENOUS | Status: DC

## 2023-10-15 MED ORDER — LIDOCAINE IN D5W 4-5 MG/ML-% IV SOLN
1.0000 mg/min | INTRAVENOUS | Status: DC
  Administered 2023-10-16: 1 mg/min via INTRAVENOUS
  Filled 2023-10-15: qty 500

## 2023-10-15 MED ORDER — SODIUM CHLORIDE 0.9 % IV SOLN
INTRAVENOUS | Status: AC | PRN
  Administered 2023-10-15: 10 mL/h via INTRAVENOUS

## 2023-10-15 MED ORDER — CALCIUM GLUCONATE-NACL 2-0.675 GM/100ML-% IV SOLN
2.0000 g | Freq: Once | INTRAVENOUS | Status: AC
  Administered 2023-10-15: 2000 mg via INTRAVENOUS
  Filled 2023-10-15: qty 100

## 2023-10-15 SURGICAL SUPPLY — 6 items
CABLE ADAPT PACING TEMP 12FT (ADAPTER) IMPLANT
KIT MICROPUNCTURE NIT STIFF (SHEATH) IMPLANT
PACK CARDIAC CATHETERIZATION (CUSTOM PROCEDURE TRAY) ×1 IMPLANT
SHEATH PINNACLE 6F 10CM (SHEATH) IMPLANT
SHIELD CATH-GARD CONTAMINATION (MISCELLANEOUS) IMPLANT
WIRE PACING TEMP ST TIP 5 (CATHETERS) IMPLANT

## 2023-10-15 NOTE — Interval H&P Note (Signed)
History and Physical Interval Note:  10/15/2023 11:41 AM  Caleb [name]  has presented today for surgery, with the diagnosis of heart failure.  The various methods of treatment have been discussed with the patient and family. After consideration of risks, benefits and other options for treatment, the patient has consented to  Procedure(s): TEMPORARY PACEMAKER (N/A) as a surgical intervention.  The patient's history has been reviewed, patient examined, no change in status, stable for surgery.  I have reviewed the patient's chart and labs.  Questions were answered to the patient's satisfaction.     [name]

## 2023-10-15 NOTE — H&P (View-Only) (Signed)
ECMO NOTE:   Indication: Cardiogenic shock/refractory VF   Initial cannulation date: 10/13/23   ECMO type: VA ECMO   Dual lumen inflow/return cannula:   1) 25 FR RFV multi-stage venous drainage 2) 19 FR L CFA arterial return 3) R& L SFA distal perfusion catheters 4) R CFA Impella CP vent  Daily data:   VA ECMO  Flow 4.4L RPM 3735 Sweep  6L  Impella CP P-2 with 1.5L flow Waveforms ok Position confirmed under echo Impella position "unknown" alarm is a function of poor cardiac contractility   Labs:   ABG    Component Value Date/Time   PHART 7.366 10/15/2023 0933   PCO2ART 38.5 10/15/2023 0933   PO2ART 468 (H) 10/15/2023 0933   HCO3 22.1 10/15/2023 0933   TCO2 23 10/15/2023 0933   ACIDBASEDEF 3.0 (H) 10/15/2023 0933   O2SAT 100 10/15/2023 0933    Plan:  - Continue ECMO support with Impella CP vent - Continue low dose bival, increase as PTT 36 this morning - Wean inotropes/pressors as able   D/w CCM at bedside  [name], MD  10:34 AM

## 2023-10-15 NOTE — Progress Notes (Signed)
Brief Nutrition Follow-up:  Pt remains on vent support (100% FiO2), VA ECMO, Impella, CRRT Recurrent VF requiring defibrillation today, taken to cath lab for temp pacer. Levophed at 14, vasopressin 0.03, epinephrine 7.  Not appropriate for initiation of TF today given continued hemodynamic instability. Continue to recommend initiation of trickle TF as soon as medically able; TF recs in noted from 12/16.  Romelle Starcher MS, RDN, LDN, CNSC Registered Dietitian 3 Clinical Nutrition RD Inpatient Contact Info in Amion

## 2023-10-15 NOTE — Progress Notes (Signed)
Patient blood gas at 13:33, 10/15/2023  pH 7.331 PCO2 33.3 PO2 539 BE, B -8 HCO3 17.7 TCO2 19 SO2% 100  Results are not in chart yet because of scanning error, point of care edit sheet sent and hopefully will be available soon.   Dr. Merrily Pew made aware of bicarb and given.

## 2023-10-15 NOTE — Plan of Care (Signed)
  Problem: Clinical Measurements: Goal: Will remain free from infection Outcome: Progressing Goal: Diagnostic test results will improve Outcome: Progressing   Problem: Elimination: Goal: Will not experience complications related to urinary retention Outcome: Progressing   Problem: Safety: Goal: Ability to remain free from injury will improve Outcome: Progressing   Problem: Cardiovascular: Goal: Ability to achieve and maintain adequate cardiovascular perfusion will improve Outcome: Progressing

## 2023-10-15 NOTE — Progress Notes (Signed)
NAMEFawzi [name], MRN:  [id number], DOB:  [birth year], LOS: 2 ADMISSION DATE:  10/13/2023, CONSULTATION DATE:  10/13/23 REFERRING MD:  EDP, CHIEF COMPLAINT:  chest pain   History of Present Illness:  57 year old man w/ hx of CAD s/p PCI in 2019 presenting with crushing chest pain.  EKG with STEMI.  Pain was a little abnormal in character so taken for dissection protocol CTA which was negative.  In CT Vfib arrest x 3 with ROSC, brought emergently to cath lab with LUCAS compressions ongoing.  PCCM consulted to assist with management.  Pertinent  Medical History  CAD w/ prior STEMI and PCI Ischemic cardiomyopathy EF 45-50% back in 2016  Significant Hospital Events: Including procedures, antibiotic start and stop dates in addition to other pertinent events   12/15 admit, code, ECMO  Interim History / Subjective:  Vasopressor requirement improved Patient PaO2 dropped below 100, currently on 100% FiO2 on ventilator and 90% on VA ECMO Received 120 mg of Lasix with 900 cc urine output Continue to require multiple vasopressor support On CRRT  Lactate is trending down  Continue to have nonsustained V. tach, was shocked twice this morning   Objective   Blood pressure (!) 78/74, pulse 79, temperature 98.2 F (36.8 C), resp. rate 20, height 5\' 1"  (1.549 m), weight 82.6 kg, SpO2 97%. PAP: (22-28)/(17-24) 23/20 CVP:  [8 mmHg-15 mmHg] 12 mmHg  Vent Mode: PCV FiO2 (%):  [50 %-100 %] 100 % Set Rate:  [20 bmp] 20 bmp PEEP:  [10 cmH20] 10 cmH20 Plateau Pressure:  [23 cmH20] 23 cmH20   Intake/Output Summary (Last 24 hours) at 10/15/2023 2725 Last data filed at 10/15/2023 0900 Gross per 24 hour  Intake 5208.08 ml  Output 4985.3 ml  Net 222.78 ml   Filed Weights   10/13/23 0900 10/15/23 0500  Weight: 72.6 kg 82.6 kg    Examination: General: Crtitically ill-appearing middle-aged male, orally intubated HEENT: [place]/AT, eyes anicteric.  ETT and OGT in place Neuro: Sedated, not following  commands.  Eyes are closed.  Pupils 3 mm bilateral reactive to light Chest: Bilateral crackles all over, no wheezes or rhonchi Heart: Bradycardic with heart rate in the 50s, no murmurs or gallops Abdomen: Soft, nondistended, bowel sounds present Skin: No rash  Na 133, K 5.8, BUN 10, Cr 1.74, serum phosphorus 4.8, AST 579, ALT 217, LDH 1908 Lactate 6.5 7.36/38/468/100% Hemoglobin 8.9, WBC 10.4, platelet 106 A1c 6.4  Resolved Hospital Problem list   N/A  Assessment & Plan:  Acute STEMI, status post Vfib arrest on VA ECMO and Impella at P2 Sustained V. tach Acute on chronic biventricular heart failure with cardiogenic shock- s/p eCPR 10/13/23 Acute respiratory failure with hypoxia likely due to aspiration pneumonia Hx CAD with prior PCI Acute kidney injury due to ischemic ATN on CRRT Shock liver Hypokalemia/hypophosphatemia/hypocalcemia, improving Lactic acidosis Anemia and thrombocytopenia critical illness Prediabetes with hyperglycemia Obesity  Advanced heart failure team is following Continue VA ECMO Patient has Impella at P2 with flow 1.4 L Patient continued to have runs of V. tach Lidocaine was briefly stopped this morning, now restarted back, continue amiodarone Now going for transvenous pacing He was shocked twice this morning for sustained VT's Bedside echocardiogram showed severely reduced EF Continue epinephrine, norepinephrine and vasopressin with MAP goal 65 Continue aspirin and Brilinta Continue lung protective ventilation VAP prevention bundle in place Titrate FiO2 and PEEP with O2 sat goal 92% Continue IV antibiotic with vancomycin and meropenem PAD protocol with Versed and  fentanyl Continue CRRT Bicarbonate infusion was stopped Aggressively supplement electrolytes EEG showed no active seizures LFTs remain elevated but is stable Lactate came down to 4.3 but now it is back up again to 6.5, closely monitor Monitor H&H and platelet count Hemoglobin A1c 6.4,  monitor fingerstick with goal 140-180 Continue sliding scale and long-acting insulin Continue n.p.o. for now due to profound shock Diet and exercise counseling as appropriate  Best Practice (right click and "Reselect all SmartList Selections" daily)   Diet/type: NPO DVT prophylaxis bival Pressure ulcer(s): N/A GI prophylaxis: PPI Lines: Central line Foley:  Yes, and it is still needed Code Status:  full code Last date of multidisciplinary goals of care discussion: Per primary team  Labs   CBC: Recent Labs  Lab 10/13/23 1941 10/13/23 2041 10/14/23 0025 10/14/23 0118 10/14/23 0421 10/14/23 0617 10/14/23 1559 10/14/23 1606 10/14/23 2358 10/15/23 0104 10/15/23 0413 10/15/23 0416 10/15/23 0604  WBC 16.8*  --  6.0  --  4.5  --  7.2  --   --   --   --  10.4  --   HGB 9.2*   < > 8.7*   < > 8.4*   < > 7.8*   < > 8.8* 8.8* 8.5*  8.5* 8.9* 8.5*  HCT 28.2*   < > 27.0*   < > 25.1*   < > 23.8*   < > 26.0* 26.0* 25.0*  25.0* 27.2* 25.0*  MCV 81.7  --  82.1  --  80.2  --  81.2  --   --   --   --  80.5  --   PLT 207  --  169  --  142*  --  108*  --   --   --   --  106*  --    < > = values in this interval not displayed.    Basic Metabolic Panel: Recent Labs  Lab 10/13/23 1719 10/13/23 1940 10/14/23 0025 10/14/23 0118 10/14/23 0421 10/14/23 0617 10/14/23 1600 10/14/23 1606 10/14/23 2358 10/15/23 0104 10/15/23 0413 10/15/23 0416 10/15/23 0604  NA 140   < > 138   < > 139   < > 137   < > 133* 133* 133*  132* 133* 133*  K 2.7*   < > 2.7*   < > 3.2*   < > 4.0   < > 5.6* 5.6* 5.7*  5.7* 5.8* 5.7*  CL 101  --  102  --  101  --  101  --   --   --  97* 101  --   CO2 14*  --  21*  --  24  --  26  --   --   --   --  22  --   GLUCOSE 555*  --  320*  --  145*  --  99  --   --   --  110* 108*  --   BUN 22*  --  18  --  15  --  11  --   --   --  9 10  --   CREATININE 2.60*  --  2.25*  --  2.09*  --  1.61*  --   --   --  1.70* 1.74*  --   CALCIUM 8.2*  --  7.2*  --  7.1*  --  7.2*   --   --   --   --  7.0*  --   MG  --   --   --   --  2.3  --   --   --   --   --   --  2.2  --   PHOS 2.6  --   --   --  1.0*  --  4.5  --   --   --   --  4.8*  --    < > = values in this interval not displayed.   GFR: Estimated Creatinine Clearance: 43.2 mL/min (A) (by C-G formula based on SCr of 1.74 mg/dL (H)). Recent Labs  Lab 10/14/23 0025 10/14/23 0120 10/14/23 0421 10/14/23 0526 10/14/23 1011 10/14/23 1559 10/15/23 0416 10/15/23 0429  WBC 6.0  --  4.5  --   --  7.2 10.4  --   LATICACIDVEN  --  10.4*  --  8.3* 5.3*  --   --  4.7*    Liver Function Tests: Recent Labs  Lab 10/13/23 1338 10/14/23 0421 10/14/23 1600 10/15/23 0416  AST 616* 612*  --  579*  ALT 135* 112*  --  217*  ALKPHOS 73 47  --  66  BILITOT 1.1 1.6*  --  5.2*  PROT 3.8* 3.7*  --  4.0*  ALBUMIN 1.5* 2.2*  2.2* 2.0* 2.2*  2.2*   No results for input(s): "LIPASE", "AMYLASE" in the last 168 hours. No results for input(s): "AMMONIA" in the last 168 hours.  ABG    Component Value Date/Time   PHART 7.442 10/15/2023 0604   PCO2ART 34.3 10/15/2023 0604   PO2ART 160 (H) 10/15/2023 0604   HCO3 23.5 10/15/2023 0604   TCO2 25 10/15/2023 0604   ACIDBASEDEF 2.0 10/14/2023 0118   O2SAT 100 10/15/2023 0604     Coagulation Profile: Recent Labs  Lab 10/13/23 1210 10/14/23 0421 10/15/23 0416  INR 1.8* 1.8* 2.5*    Cardiac Enzymes: No results for input(s): "CKTOTAL", "CKMB", "CKMBINDEX", "TROPONINI" in the last 168 hours.  HbA1C: Hgb A1c MFr Bld  Date/Time Value Ref Range Status  10/14/2023 04:25 AM 6.4 (H) 4.8 - 5.6 % Final    Comment:    (NOTE) Pre diabetes:          5.7%-6.4%  Diabetes:              >6.4%  Glycemic control for   <7.0% adults with diabetes   06/07/2018 01:11 AM 5.8 (H) 4.8 - 5.6 % Final    Comment:    (NOTE) Pre diabetes:          5.7%-6.4% Diabetes:              >6.4% Glycemic control for   <7.0% adults with diabetes     CBG: Recent Labs  Lab 10/14/23 1706  10/14/23 1958 10/14/23 2356 10/15/23 0410 10/15/23 0850  GLUCAP 113* 96 109* 127* 113*    The patient is critically ill due to acute STEMI/cardiac arrest/cardiogenic shock.  Critical care was necessary to treat or prevent imminent or life-threatening deterioration.  Critical care was time spent personally by me on the following activities: development of treatment plan with patient and/or surrogate as well as nursing, discussions with consultants, evaluation of patient's response to treatment, examination of patient, obtaining history from patient or surrogate, ordering and performing treatments and interventions, ordering and review of laboratory studies, ordering and review of radiographic studies, pulse oximetry, re-evaluation of patient's condition and participation in multidisciplinary rounds.   During this encounter critical care time was devoted to patient care services described in this note for 44 minutes.     Chales Pelissier  Merrily Pew, MD [facility] See Amion for pager If no response to pager, please call [phone] until 7pm After 7pm, Please call E-link [phone]

## 2023-10-15 NOTE — Progress Notes (Signed)
Patient went into ventricular fib this morning at 08:11 and was shocked with 200J and converted into a sinus brady rhythm. Was given a 150mg  bolus of amiodarone and a 50mg  bolus of lidocaine, as well as restarting the lidocaine drip. Dr. Elwyn Lade at bedside throughout.  Patient went into VF again at 10:40 and was shocked with 200J and converted to a NSR.  Dr. Elwyn Lade decided to take patient to cath lab to insert a transvenous temp pacer.  Patient went into VF at 11:28 on the way to the cath lab and was shocked with 120J and converted into NSR.   Pt. Now has a TVP in the rt internal jugular and is doing well with no more cardiac events since placement.

## 2023-10-15 NOTE — Progress Notes (Signed)
ECMO NOTE:   Indication: Cardiogenic shock/refractory VF   Initial cannulation date: 10/13/23   ECMO type: [facility] ECMO   Dual lumen inflow/return cannula:   1) 25 FR RFV multi-stage venous drainage 2) 19 FR L CFA arterial return 3) R& L SFA distal perfusion catheters 4) R CFA Impella CP vent  Daily data:   [facility] ECMO  Flow 4.4L RPM 3735 Sweep  6L  Impella CP P-2 with 1.5L flow Waveforms ok Position confirmed under echo Impella position "unknown" alarm is a function of poor cardiac contractility   Labs:   ABG    Component Value Date/Time   PHART 7.366 10/15/2023 0933   PCO2ART 38.5 10/15/2023 0933   PO2ART 468 (H) 10/15/2023 0933   HCO3 22.1 10/15/2023 0933   TCO2 23 10/15/2023 0933   ACIDBASEDEF 3.0 (H) 10/15/2023 0933   O2SAT 100 10/15/2023 0933    Plan:  - Continue ECMO support with Impella CP vent - Continue low dose bival, increase as PTT 36 this morning - Wean inotropes/pressors as able   D/w CCM at bedside  [name], MD  10:34 AM

## 2023-10-15 NOTE — Progress Notes (Signed)
RT transported patient on vent to cath lab without complications.

## 2023-10-15 NOTE — Progress Notes (Signed)
Patient ID: Boruch Mascorro, male   DOB: 1966-08-07, 57 y.o.   MRN: [id number]    Progress Note from the Palliative Medicine Team at [facility]   Patient Name: Amrit Velaquez        Date: 10/15/2023 DOB: 1965-11-29  Age: 57 y.o. MRN#: [id number] Attending Physician: [name], MD Primary Care Physician: Patient, No Pcp Per Admit Date: 10/13/2023   Reason for Consultation/Follow-up   Establishing Goals of Care   HPI/ Brief Hospital Review  57 y.o. male   admitted on 10/13/2023 with w/ hx of CAD s/p PCI in 2019 presenting with crushing chest pain. EKG with STEMI. Pain was a little abnormal in character so taken for dissection protocol CTA which was negative.    In CT Vfib arrest x 3 with ROSC, brought emergently to cath lab with LUCAS compressions ongoing.     Ischemic cardiomyopathy given EF of 45 to 50% in 2016   Patient remains on multiple vasopressors, intubated,  CRRT, Impella, ECMO.    Patient is critically ill.   Family face treatment option decisions, advanced directive decisions and anticipatory care needs.   Subjective  Extensive chart review has been completed prior to meeting with patient/family  including labs, vital signs, imaging, progress/consult notes, orders, medications and available advance directive documents.    This NP assessed patient at the bedside and then met with family as scheduled along with interpreter for ongoing education regarding current medical situation. Patient's mother, 3 children and 2 friends were present for meeting.  Created space and opportunity for family to share thoughts and feelings regarding current medical situation.  All understand the seriousness however at this point they hope that medical interventions can "make things better"  We explored patient's values regarding healthcare.  All present verbalize patient's resistance medical intervention and treatment.  They speak to him having significant medical events in 2016 and 2019 but  with no real follow-up.  Education had regarding the seriousness of the current medical situation secondary to; refractory VT/VF arrest in setting of acute MI, acute systolic heart failure, coronary artery disease aspiratory failure, kidney injury, shock liver, DM2.  Education offered on the current life support interventions specific to ventilator, CRRT, Impella, ECMO.     Education and conversation had regarding human mortality  Education offered on the limitations of medical interventions to prolong quality of life when the body fails to thrive.    Education offered today regarding  the importance of continued conversation with family and their  medical providers regarding overall plan of care and treatment options,  ensuring decisions are within the context of the patients values and GOCs.  Family is hopeful and open to ongoing conversations with medical providers updating them on patient's progress.  PMT will continue to support holistically   Questions and concerns addressed   Discussed with  nursing staff   Time: 65  minutes  Detailed review of medical records ( labs, imaging, vital signs), medically appropriate exam ( MS, skin, cardiac,  resp)   discussed with treatment team, counseling and education to patient, family, staff, documenting clinical information, medication management, coordination of care    Lorinda Creed NP  Palliative Medicine Team Team Phone # [phone] Pager [phone]

## 2023-10-15 NOTE — Progress Notes (Signed)
Patient with recurrent VF requiring defibrillation, review of rhythm shows sinus bradycardia with ventricular bigeminy. Already on lidocaine/amiodarone, s/p 1x 150mg  bolus of amiodarone and 2x 50mg  bolus of lidocaine. Given his recurrent arrhythmia plan was made to take to the cath lab for emergent temporary pacing wire placement. Additional event on the way to the cath lab. Did not lose flows during this time, though suction alarms on the impella while in VF. After temp wire placement rhythm is much more stable, will continue to pull fluid with CRRT. Family updated.   Additional critical care time outside of procedures: 45 minutes.

## 2023-10-15 NOTE — Procedures (Signed)
Admit: 10/13/2023 LOS: 2  Caleb [name] with STEMI, refractory VF/VT, cardiogenic shock  Current CRRT Prescription: Start Date: 10/13/23 Catheter: L Fem Temp HD Cath placed 10/13/23 AHF BFR: 200 Pre Blood Pump:500 4K DFR: 1500 4K Replacement Rate: 400 4K Goal UF: Attempting some UF, up to 12mL/h net neg Anticoagulation: systemic per AHF; angiomax Clotting: None since initiation  S: K consistently mid 5s and P up to 4.8 Remains on VA ECMO and Impella Amio, NE, Epi, VP, Lidocaine gtt Made UOP 0.9L yest AM ABG 7.44 / 34 / 160  O: 12/16 0701 - 12/17 0700 In: 5279.1 [I.V.:3191.3; Blood:426; NG/GT:145; IV Piggyback:1166] Out: 4595.3 [Urine:901; Emesis/NG output:250]  Filed Weights   10/13/23 0900 10/15/23 0500  Weight: 72.6 kg 82.6 kg    Recent Labs  Lab 10/14/23 0421 10/14/23 0617 10/14/23 1600 10/14/23 1606 10/15/23 0413 10/15/23 0416 10/15/23 0604 10/15/23 0933  NA 139   < > 137   < > 133*  132* 133* 133* 131*  K 3.2*   < > 4.0   < > 5.7*  5.7* 5.8* 5.7* 5.5*  CL 101  --  101  --  97* 101  --   --   CO2 24  --  26  --   --  22  --   --   GLUCOSE 145*  --  99  --  110* 108*  --   --   BUN 15  --  11  --  9 10  --   --   CREATININE 2.09*  --  1.61*  --  1.70* 1.74*  --   --   CALCIUM 7.1*  --  7.2*  --   --  7.0*  --   --   PHOS 1.0*  --  4.5  --   --  4.8*  --   --    < > = values in this interval not displayed.   Recent Labs  Lab 10/14/23 0421 10/14/23 0617 10/14/23 1559 10/14/23 1606 10/15/23 0416 10/15/23 0604 10/15/23 0933  WBC 4.5  --  7.2  --  10.4  --   --   HGB 8.4*   < > 7.8*   < > 8.9* 8.5* 13.3  HCT 25.1*   < > 23.8*   < > 27.2* 25.0* 39.0  MCV 80.2  --  81.2  --  80.5  --   --   PLT 142*  --  108*  --  106*  --   --    < > = values in this interval not displayed.    Scheduled Meds:  sodium chloride   Intravenous Once   aspirin  81 mg Per Tube Daily   busPIRone  15 mg Per Tube TID   Chlorhexidine Gluconate Cloth  6 each Topical Daily    docusate  100 mg Per Tube BID   fentaNYL  50 mcg Intravenous Once   insulin aspart  2-6 Units Subcutaneous Q4H   insulin detemir  8 Units Subcutaneous Q12H   multivitamin  1 tablet Per Tube QHS   mouth rinse  15 mL Mouth Rinse Q2H   pantoprazole (PROTONIX) IV  40 mg Intravenous QHS   polyethylene glycol  17 g Per Tube Daily   sodium chloride flush  10 mL Intravenous Q12H   sodium chloride flush  3 mL Intravenous Q12H   ticagrelor  90 mg Per Tube BID   Continuous Infusions:  amiodarone 60 mg/hr (10/15/23 1000)   bivalirudin (ANGIOMAX)  250 mg in sodium chloride 0.9 % 500 mL (0.5 mg/mL) infusion 0.015 mg/kg/hr (10/15/23 1000)   epinephrine 7 mcg/min (10/15/23 1000)   fentaNYL infusion INTRAVENOUS 200 mcg/hr (10/15/23 1000)   insulin Stopped (10/14/23 1610)   lidocaine 1 mg/min (10/15/23 1000)   meropenem (MERREM) IV Stopped (10/15/23 0914)   midazolam 6 mg/hr (10/15/23 1000)   norepinephrine (LEVOPHED) Adult infusion 15 mcg/min (10/15/23 1000)   prismasol BGK 2/2.5 replacement solution 400 mL/hr at 10/15/23 1002   prismasol BGK 2/2.5 replacement solution 400 mL/hr at 10/15/23 0958   prismasol BGK 4/2.5 1,500 mL/hr at 10/15/23 2536   sodium bicarbonate 25 mEq (Impella PURGE) in dextrose 5 % 1000 mL bag 200 mL/hr at 10/13/23 1046   vancomycin Stopped (10/14/23 2101)   vasopressin 0.03 Units/min (10/15/23 1000)   PRN Meds:.dextrose, fentaNYL, heparin, heparin, midazolam, mouth rinse, sodium chloride flush  ABG    Component Value Date/Time   PHART 7.366 10/15/2023 0933   PCO2ART 38.5 10/15/2023 0933   PO2ART 468 (H) 10/15/2023 0933   HCO3 22.1 10/15/2023 0933   TCO2 23 10/15/2023 0933   ACIDBASEDEF 3.0 (H) 10/15/2023 0933   O2SAT 100 10/15/2023 0933    A/P  Dialysis dependent AKI on CRRT Start 10/13/23 using L Fem Temp HD cath AKI 2/2 cardiogenic shock as below STEMI, cardiogenic shock, refractory VF/VT Hyperkalemia Hypophosphatemia Anemia Metabolic Acidosis;  resolved  Change to all 2K prismasate pre/post, 4K dialysate Net negative as hemodynamics permit  Arita Miss, MD  [employer]

## 2023-10-15 NOTE — Progress Notes (Signed)
Advanced Heart Failure Rounding Note  PCP-Cardiologist: Tonny Bollman, MD   Subjective:     Cannulated with VA ECMO on 12/15 for VT/VF storm.  Able to increase CRRT pull overnight, urine output picked up with lasix bolus. CXR shows persistent pulmonary edema. Given concern for [facility] his impella settings were decreased to P2 with 1.1L of flow and FIO2 increase to 100% on the vent. Currently blender at 100%.  Swan noted to be deep on CXR, withdrawn.  Plan to repeat lasix bolus today and attempt to wean FI02. Bedside echocardiogram showed mild improvement in LV function, Impella withdrawn 3cm with improvement in flows.   1 episode of VF this morning requiring shock, lidocaine restarted. He is now sinus brady with occasional ventricular bigeminy. Did not drop flows during the event.    Objective:   Weight Range: 82.6 kg Body mass index is 34.41 kg/m.   Vital Signs:   Temp:  [97.5 F (36.4 C)-98.4 F (36.9 C)] 98.2 F (36.8 C) (12/17 0645) Pulse Rate:  [0-83] 79 (12/16 2315) Resp:  [8-20] 20 (12/17 0645) BP: (78-83)/(74-80) 78/74 (12/16 2315) SpO2:  [75 %-100 %] 97 % (12/17 0000) FiO2 (%):  [50 %-100 %] 100 % (12/17 0400) Weight:  [82.6 kg] 82.6 kg (12/17 0500) Last BM Date : 10/13/23  Weight change: Filed Weights   10/13/23 0900 10/15/23 0500  Weight: 72.6 kg 82.6 kg    Intake/Output:   Intake/Output Summary (Last 24 hours) at 10/15/2023 0700 Last data filed at 10/15/2023 0600 Gross per 24 hour  Intake 5144.01 ml  Output 4440.3 ml  Net 703.71 ml      Physical Exam    General: Ill-appearing, intubated HEENT: NCAT, swollen Cor: Tachycardic, cannulated on peripheral VA ECMO with right femoral vein venous drainage, left femoral arterial return, right common femoral Impella with bilateral distal perfusion catheters Lungs: Ventilated breath sounds Abdomen: soft, nontender, nondistended Extremities: improving swelling of the left lower extremity, doppler  flow in both lower extremities Neuro: Sedated   Patient Profile   Patient is a 57 year old male with a past medical history of coronary artery disease and poor medical follow-up who presented with acute LAD infarct complicated by ventricular fibrillation leading to SCAI E shock requiring VA ECMO placement.  Assessment/Plan   Refractory VT/VF arrest in setting of acute MI 12/15: Initially defibrillated > 30 times, currently on amiodarone and lidocaine, lidocaine level 3.4.  Remains on lidocaine with recurrent VF x1 this morning. Sinus rate low, if having continued ectopy may need temp wire placement.  - continue amio/lido for now.  - continue ECMO support - Keep K> 4 MG > 2   Acute systolic HF with Cardiogenic shock, SCAI E:  ECPR with VA ECMO cannulation 12/15, Impella vent. Continue epi, NE, vaso. Wean as tolerated. Impella set to P2 - Bedside echo 12/17 EF 15-20% RV severely HK - see ECMO circuit note   CAD with acute anterior MI (NSTEMI) - h/o LAD and RCA stents - s/p PCI/DES to occluded LAD on 12/15. RCA CTO - Continue DAPT/statin   Acute hypoxic respiratory failure - due to cardiac arrest/pulmonary edema - on vent and VA ECMO - CCM co-managing - Volume removal with CRRT - Lasix 120mg  IV x1 again today   Acute blood loss anemia with L groin hematoma - pressure held. Fem stop in place - transfuse to keep Hgb > 8 g/dl   AKI - due to ATN - HD cath in place - Nephrology consulted -  Continue CRRT   Shock liver - supportive care   DM2 - insulin gtt   Noncompliance - has not followed up after previous MIs   Length of Stay: 2  [name], MD  10/15/2023, 7:00 AM  Advanced Heart Failure Team Pager [phone] (M-F; 7a - 5p)  Please contact [facility] for night-coverage after hours (5p -7a ) and weekends on [email or web address]  CRITICAL CARE Performed by: [name]   Total critical care time: 60 minutes  Critical care time was exclusive of separately  billable procedures and treating other patients.  Critical care was necessary to treat or prevent imminent or life-threatening deterioration.  Critical care was time spent personally by me on the following activities: development of treatment plan with patient and/or surrogate as well as nursing, discussions with consultants, evaluation of patient's response to treatment, examination of patient, obtaining history from patient or surrogate, ordering and performing treatments and interventions, ordering and review of laboratory studies, ordering and review of radiographic studies, pulse oximetry and re-evaluation of patient's condition.

## 2023-10-15 NOTE — Progress Notes (Signed)
PHARMACY - ANTICOAGULATION CONSULT NOTE  Pharmacy Consult for bivalirudin  Indication:  ECMO/Impella CP  Allergies  Allergen Reactions   Lisinopril Rash         Patient Measurements: Height: 5\' 1"  (154.9 cm) Weight: 72.6 kg (160 lb) (Estimated weight) IBW/kg (Calculated) : 52.3 Heparin Dosing Weight: 67.5 kg  Vital Signs: Temp: 98.2 F (36.8 C) (12/17 0445) Temp Source: Core (12/17 0400) BP: 78/74 (12/16 2315) Pulse Rate: 79 (12/16 2315)  Labs: Recent Labs    10/13/23 1210 10/13/23 1311 10/14/23 0421 10/14/23 0615 10/14/23 1559 10/14/23 1600 10/14/23 1606 10/14/23 2250 10/14/23 2358 10/15/23 0104 10/15/23 0413 10/15/23 0416  HGB 10.0*   < > 8.4*   < > 7.8*  --    < >  --    < > 8.8* 8.5*  8.5* 8.9*  HCT 32.3*   < > 25.1*   < > 23.8*  --    < >  --    < > 26.0* 25.0*  25.0* 27.2*  PLT 272   < > 142*  --  108*  --   --   --   --   --   --  106*  APTT >200*   < >  --    < >  --  59*  --  69*  --   --   --  36  LABPROT 20.9*  --  21.2*  --   --   --   --   --   --   --   --  27.1*  INR 1.8*  --  1.8*  --   --   --   --   --   --   --   --  2.5*  CREATININE  --    < > 2.09*  --   --  1.61*  --   --   --   --  1.70* 1.74*   < > = values in this interval not displayed.    Estimated Creatinine Clearance: 40.5 mL/min (A) (by C-G formula based on SCr of 1.74 mg/dL (H)).   Medical History: Past Medical History:  Diagnosis Date   CAD (coronary artery disease) 09/03/2015   Inf STEMI 2016 tx with DES to RCA and staged PCI with DES x 2 to LAD // Inf STEMI 8/19 - 2/2 stent thrombosis - LHC:  LAD stents patent, OM1 90, dRCA 100 >> PCI:  Thrombectomy and DES to dRCA   Coronary artery disease    a. Inf STEMI 08/2015 s/p DES to RCA then staged DESx2 to prox and distal LAD;  residual 90% small OM1 which is being treated medically.   Former tobacco use    Hyperlipemia    Ischemic cardiomyopathy    a. 2D echo 09/02/15: mild LVH, EF 45-50%, mild HK of inferolateral myocardium,  grade 1 DD, mild AI/MR.   ST elevation myocardial infarction (STEMI) of inferior wall (HCC) 08/31/2015    Medications:  Scheduled:   sodium chloride   Intravenous Once   aspirin  81 mg Per Tube Daily   Chlorhexidine Gluconate Cloth  6 each Topical Daily   docusate  100 mg Per Tube BID   fentaNYL  50 mcg Intravenous Once   insulin aspart  2-6 Units Subcutaneous Q4H   insulin detemir  8 Units Subcutaneous Q12H   multivitamin  1 tablet Per Tube QHS   mouth rinse  15 mL Mouth Rinse Q2H   pantoprazole (PROTONIX) IV  40  mg Intravenous QHS   polyethylene glycol  17 g Per Tube Daily   sodium chloride flush  10 mL Intravenous Q12H   sodium chloride flush  3 mL Intravenous Q12H   ticagrelor  90 mg Per Tube BID    Assessment: 57 yom presenting as code STEMI complicated with multiple VF arrests (3 shocks) - now on [place] ECMO. LA 3.4, increasing to >15, now down to 11.4. Received DES to LAD - was on cangrelor prior to transitioning to brilinta. Also received 10,000 units of heparin bolus during procedure.  Anticoagulation was held over night with groin hematoma - improved today s/p femstop - fibrin strands in circuit, -and restarted on low dose bivalirudin with goal aptt 60-65sec.  Hgb INR 1.8, aPTT53sec this am  plt down 100s , fibrinogen 300s, LDH 1300.   PM Update: initial aPTT after restart is low at 59 (up from prior aPTT of 56 before bivalirudin was started).   12/17 AM update:  aPTT sub-therapeutic  No issues with infusion per RN  Goal of Therapy:  Aptt 60-65sec  Monitor platelets by anticoagulation protocol: Yes   Plan:  Inc bivalirudin to 0.015 mg/kg/hr (72.6kg)  Draw aptt in 6hr and q6h for the 1st 24hr  Then aptt/ CBC q12h Monitor s/s bleeidng   Abran Duke, PharmD, BCPS Clinical Pharmacist Phone: [phone]

## 2023-10-15 NOTE — Progress Notes (Signed)
PHARMACY - ANTICOAGULATION CONSULT NOTE  Pharmacy Consult for bivalirudin  Indication:  ECMO/Impella CP  Allergies  Allergen Reactions   Lisinopril Rash         Patient Measurements: Height: 5\' 1"  (154.9 cm) Weight: 82.6 kg (182 lb 1.6 oz) IBW/kg (Calculated) : 52.3 Heparin Dosing Weight: 67.5 kg  Vital Signs: Temp: 97.9 F (36.6 C) (12/17 1100) Temp Source: Core (Comment) (12/17 0800) Pulse Rate: 79 (12/17 0921)  Labs: Recent Labs    10/13/23 1210 10/13/23 1311 10/14/23 0421 10/14/23 0615 10/14/23 1559 10/14/23 1600 10/14/23 1606 10/14/23 2250 10/14/23 2358 10/15/23 0413 10/15/23 0416 10/15/23 0604 10/15/23 0933 10/15/23 1040  HGB 10.0*   < > 8.4*   < > 7.8*  --    < >  --    < > 8.5*  8.5* 8.9* 8.5* 13.3  --   HCT 32.3*   < > 25.1*   < > 23.8*  --    < >  --    < > 25.0*  25.0* 27.2* 25.0* 39.0  --   PLT 272   < > 142*  --  108*  --   --   --   --   --  106*  --   --   --   APTT >200*   < >  --    < >  --  59*  --  69*  --   --  36  --   --  73*  LABPROT 20.9*  --  21.2*  --   --   --   --   --   --   --  27.1*  --   --   --   INR 1.8*  --  1.8*  --   --   --   --   --   --   --  2.5*  --   --   --   CREATININE  --    < > 2.09*  --   --  1.61*  --   --   --  1.70* 1.74*  --   --   --    < > = values in this interval not displayed.    Estimated Creatinine Clearance: 43.2 mL/min (A) (by C-G formula based on SCr of 1.74 mg/dL (H)).   Medical History: Past Medical History:  Diagnosis Date   CAD (coronary artery disease) 09/03/2015   Inf STEMI 2016 tx with DES to RCA and staged PCI with DES x 2 to LAD // Inf STEMI 8/19 - 2/2 stent thrombosis - LHC:  LAD stents patent, OM1 90, dRCA 100 >> PCI:  Thrombectomy and DES to dRCA   Coronary artery disease    a. Inf STEMI 08/2015 s/p DES to RCA then staged DESx2 to prox and distal LAD;  residual 90% small OM1 which is being treated medically.   Former tobacco use    Hyperlipemia    Ischemic cardiomyopathy    a. 2D  echo 09/02/15: mild LVH, EF 45-50%, mild HK of inferolateral myocardium, grade 1 DD, mild AI/MR.   ST elevation myocardial infarction (STEMI) of inferior wall (HCC) 08/31/2015    Medications:  Scheduled:   [MAR Hold] sodium chloride   Intravenous Once   [MAR Hold] aspirin  81 mg Per Tube Daily   [MAR Hold] busPIRone  15 mg Per Tube TID   [MAR Hold] Chlorhexidine Gluconate Cloth  6 each Topical Daily   [MAR Hold] docusate  100  mg Per Tube BID   [MAR Hold] fentaNYL  50 mcg Intravenous Once   [MAR Hold] insulin aspart  2-6 Units Subcutaneous Q4H   [MAR Hold] insulin detemir  8 Units Subcutaneous Q12H   [MAR Hold] multivitamin  1 tablet Per Tube QHS   [MAR Hold] mouth rinse  15 mL Mouth Rinse Q2H   [MAR Hold] pantoprazole (PROTONIX) IV  40 mg Intravenous QHS   [MAR Hold] polyethylene glycol  17 g Per Tube Daily   [MAR Hold] sodium chloride flush  10 mL Intravenous Q12H   [MAR Hold] sodium chloride flush  3 mL Intravenous Q12H   [MAR Hold] ticagrelor  90 mg Per Tube BID    Assessment: 57 yom presenting as code STEMI complicated with multiple VF arrests (3 shocks) - now on [place] ECMO. LA 3.4, increasing to >15, now down to 11.4. Received DES to LAD - was on cangrelor prior to transitioning to brilinta. Also received 10,000 units of heparin bolus during procedure.  Anticoagulation was held initially with groin hematoma - remains stable s/p femstop. aPTT is supratherapeutic at 73, on bivalirudin@0 .015 mg/kg/hr. INR 2.5. Hgb 8.5, plt 106, fibrinogen 381. LDH increased to 1908. Stable fibrin strands in circuit. No bleeding or infusion issues. Impella running at P2 without any issues.   Goal of Therapy:  aPTT 60-65 sec  Monitor platelets by anticoagulation protocol: Yes   Plan:  Decrease bivalirudin to 0.012 mg/kg/hr (using weight of 72.6kg)  Draw aPTT in 6hr then aPTT/CBC q12h once stable in goal range  Monitor s/s bleeidng   Thank you for allowing pharmacy to participate in this patient's  care,  Sherron Monday, PharmD, BCCCP Clinical Pharmacist  Phone: [phone] 10/15/2023 12:08 PM  Please check AMION for all [facility] Pharmacy phone numbers After 10:00 PM, call Main Pharmacy [phone]

## 2023-10-15 NOTE — Plan of Care (Signed)
  Problem: Clinical Measurements: Goal: Respiratory complications will improve Outcome: Progressing   Problem: Clinical Measurements: Goal: Diagnostic test results will improve Outcome: Not Progressing   Problem: Nutrition: Goal: Adequate nutrition will be maintained Outcome: Not Progressing   Problem: Elimination: Goal: Will not experience complications related to bowel motility Outcome: Not Progressing Goal: Will not experience complications related to urinary retention Outcome: Not Progressing

## 2023-10-15 NOTE — Progress Notes (Signed)
PHARMACY - ANTICOAGULATION CONSULT NOTE  Pharmacy Consult for bivalirudin  Indication:  ECMO/Impella CP  Allergies  Allergen Reactions   Lisinopril Rash         Patient Measurements: Height: 5\' 1"  (154.9 cm) Weight: 82.6 kg (182 lb 1.6 oz) IBW/kg (Calculated) : 52.3 Heparin Dosing Weight: 67.5 kg  Vital Signs: Temp: 98.1 F (36.7 C) (12/17 1915) Temp Source: Core (12/17 1600) BP: 90/86 (12/17 1223) Pulse Rate: 80 (12/17 1600)  Labs: Recent Labs    10/13/23 1210 10/13/23 1311 10/14/23 0421 10/14/23 0615 10/14/23 1559 10/14/23 1600 10/15/23 0413 10/15/23 0416 10/15/23 0604 10/15/23 1040 10/15/23 1333 10/15/23 1554 10/15/23 1602 10/15/23 1901  HGB 10.0*   < > 8.4*   < > 7.8*   < > 8.5*  8.5* 8.9*   < >  --  7.5* 8.1* 7.5*  --   HCT 32.3*   < > 25.1*   < > 23.8*   < > 25.0*  25.0* 27.2*   < >  --  22.0* 25.2* 22.0*  --   PLT 272   < > 142*  --  108*  --   --  106*  --   --   --  82*  --   --   APTT >200*   < >  --    < >  --    < >  --  36  --  73*  --   --   --  71*  LABPROT 20.9*  --  21.2*  --   --   --   --  27.1*  --   --   --   --   --   --   INR 1.8*  --  1.8*  --   --   --   --  2.5*  --   --   --   --   --   --   CREATININE  --    < > 2.09*  --   --    < > 1.70* 1.74*  --   --   --  2.08*  --   --    < > = values in this interval not displayed.    Estimated Creatinine Clearance: 36.1 mL/min (A) (by C-G formula based on SCr of 2.08 mg/dL (H)).   Medical History: Past Medical History:  Diagnosis Date   CAD (coronary artery disease) 09/03/2015   Inf STEMI 2016 tx with DES to RCA and staged PCI with DES x 2 to LAD // Inf STEMI 8/19 - 2/2 stent thrombosis - LHC:  LAD stents patent, OM1 90, dRCA 100 >> PCI:  Thrombectomy and DES to dRCA   Coronary artery disease    a. Inf STEMI 08/2015 s/p DES to RCA then staged DESx2 to prox and distal LAD;  residual 90% small OM1 which is being treated medically.   Former tobacco use    Hyperlipemia    Ischemic  cardiomyopathy    a. 2D echo 09/02/15: mild LVH, EF 45-50%, mild HK of inferolateral myocardium, grade 1 DD, mild AI/MR.   ST elevation myocardial infarction (STEMI) of inferior wall (HCC) 08/31/2015    Medications:  Scheduled:   sodium chloride   Intravenous Once   aspirin  81 mg Per Tube Daily   busPIRone  15 mg Per Tube TID   Chlorhexidine Gluconate Cloth  6 each Topical Daily   docusate  100 mg Per Tube BID   fentaNYL  50  mcg Intravenous Once   insulin aspart  2-6 Units Subcutaneous Q4H   multivitamin  1 tablet Per Tube QHS   mouth rinse  15 mL Mouth Rinse Q2H   pantoprazole (PROTONIX) IV  40 mg Intravenous QHS   polyethylene glycol  17 g Per Tube Daily   sodium bicarbonate       sodium bicarbonate       sodium chloride flush  10 mL Intravenous Q12H   sodium chloride flush  3 mL Intravenous Q12H   ticagrelor  90 mg Per Tube BID    Assessment: 57 yom presenting as code STEMI complicated with multiple VF arrests (3 shocks) - now on [place] ECMO. LA 3.4, increasing to >15, now down to 11.4. Received DES to LAD - was on cangrelor prior to transitioning to brilinta. Also received 10,000 units of heparin bolus during procedure.  Anticoagulation was held initially with groin hematoma - remains stable s/p femstop. aPTT is supratherapeutic at 73, on bivalirudin@0 .015 mg/kg/hr. INR 2.5. Hgb 8.5, plt 106, fibrinogen 381. LDH increased to 1908. Stable fibrin strands in circuit. No bleeding or infusion issues. Impella running at P2 without any issues.   PM Update: aPTT remains slightly elevated at 71 for low goal of 60-65 on current rate of 0.12 mg/kg/hr. Platelets down at 75. Hgb low-stable at 8.2. Will reduce again.   Goal of Therapy:  aPTT 60-65 sec  Monitor platelets by anticoagulation protocol: Yes   Plan:  Decrease bivalirudin back to 0.0115 mg/kg/hr (using weight of 72.6kg)  Draw aPTT in 6hr then aPTT/CBC q12h once stable in goal range  Monitor s/s bleeidng   Thank you for allowing  pharmacy to participate in this patient's care,  Link Snuffer, PharmD, BCPS, BCCCP Please refer to [facility] for [facility] numbers 10/15/2023 7:55 PM

## 2023-10-15 NOTE — Progress Notes (Signed)
  Patient seen on evening rounds.   Patient with increasing lactic acid throughout the day.  4.7 -> 6.5 -> 10.2 -> 9.3   No further VT with TVPacing.   MAPs have been stable in 70s with good flows on ECMO ~4.5L and 1.8L on impella   Currently epi at 7, NE at 15 and VP 0.03  On exam he has good bowel sounds and belly is non-distended. He has not had any BRBPR to suggest acute gut ischemia.   No other clear sources of tissue loss. Extremities appear well perfused. With good O2 signals on NIR.   I looked at Impella position under echo guidance and repositioned the tip slightly in an attempt to optimize flow.   Will give 2 amps bicarb. Continue CVVHD for clearance. Cycle lactates. Plan discussed with RN and ECMO staff at bedside.   Trajectory remains very concerning  45 mins CCT not including Impella repositioning time.   Arvilla Meres, MD  6:41 PM

## 2023-10-16 ENCOUNTER — Inpatient Hospital Stay (HOSPITAL_COMMUNITY): Payer: Commercial Managed Care - PPO

## 2023-10-16 ENCOUNTER — Encounter (HOSPITAL_COMMUNITY): Payer: Self-pay | Admitting: Cardiology

## 2023-10-16 DIAGNOSIS — I213 ST elevation (STEMI) myocardial infarction of unspecified site: Secondary | ICD-10-CM | POA: Diagnosis not present

## 2023-10-16 DIAGNOSIS — R570 Cardiogenic shock: Secondary | ICD-10-CM | POA: Diagnosis not present

## 2023-10-16 LAB — POCT I-STAT 7, (LYTES, BLD GAS, ICA,H+H)
Acid-base deficit: 3 mmol/L — ABNORMAL HIGH (ref 0.0–2.0)
Acid-base deficit: 3 mmol/L — ABNORMAL HIGH (ref 0.0–2.0)
Acid-base deficit: 4 mmol/L — ABNORMAL HIGH (ref 0.0–2.0)
Acid-base deficit: 6 mmol/L — ABNORMAL HIGH (ref 0.0–2.0)
Acid-base deficit: 6 mmol/L — ABNORMAL HIGH (ref 0.0–2.0)
Acid-base deficit: 8 mmol/L — ABNORMAL HIGH (ref 0.0–2.0)
Bicarbonate: 17.1 mmol/L — ABNORMAL LOW (ref 20.0–28.0)
Bicarbonate: 18.7 mmol/L — ABNORMAL LOW (ref 20.0–28.0)
Bicarbonate: 20.1 mmol/L (ref 20.0–28.0)
Bicarbonate: 20.1 mmol/L (ref 20.0–28.0)
Bicarbonate: 21.3 mmol/L (ref 20.0–28.0)
Bicarbonate: 21.5 mmol/L (ref 20.0–28.0)
Calcium, Ion: 0.91 mmol/L — ABNORMAL LOW (ref 1.15–1.40)
Calcium, Ion: 0.92 mmol/L — ABNORMAL LOW (ref 1.15–1.40)
Calcium, Ion: 0.94 mmol/L — ABNORMAL LOW (ref 1.15–1.40)
Calcium, Ion: 0.99 mmol/L — ABNORMAL LOW (ref 1.15–1.40)
Calcium, Ion: 1.03 mmol/L — ABNORMAL LOW (ref 1.15–1.40)
Calcium, Ion: 1.04 mmol/L — ABNORMAL LOW (ref 1.15–1.40)
HCT: 21 % — ABNORMAL LOW (ref 39.0–52.0)
HCT: 22 % — ABNORMAL LOW (ref 39.0–52.0)
HCT: 24 % — ABNORMAL LOW (ref 39.0–52.0)
HCT: 25 % — ABNORMAL LOW (ref 39.0–52.0)
HCT: 26 % — ABNORMAL LOW (ref 39.0–52.0)
HCT: 26 % — ABNORMAL LOW (ref 39.0–52.0)
Hemoglobin: 7.1 g/dL — ABNORMAL LOW (ref 13.0–17.0)
Hemoglobin: 7.5 g/dL — ABNORMAL LOW (ref 13.0–17.0)
Hemoglobin: 8.2 g/dL — ABNORMAL LOW (ref 13.0–17.0)
Hemoglobin: 8.5 g/dL — ABNORMAL LOW (ref 13.0–17.0)
Hemoglobin: 8.8 g/dL — ABNORMAL LOW (ref 13.0–17.0)
Hemoglobin: 8.8 g/dL — ABNORMAL LOW (ref 13.0–17.0)
O2 Saturation: 100 %
O2 Saturation: 100 %
O2 Saturation: 82 %
O2 Saturation: 99 %
O2 Saturation: 99 %
O2 Saturation: 99 %
Patient temperature: 36.6
Patient temperature: 36.6
Patient temperature: 36.6
Patient temperature: 36.6
Patient temperature: 36.7
Patient temperature: 36.7
Potassium: 4.3 mmol/L (ref 3.5–5.1)
Potassium: 4.4 mmol/L (ref 3.5–5.1)
Potassium: 4.7 mmol/L (ref 3.5–5.1)
Potassium: 4.9 mmol/L (ref 3.5–5.1)
Potassium: 5.1 mmol/L (ref 3.5–5.1)
Potassium: 5.2 mmol/L — ABNORMAL HIGH (ref 3.5–5.1)
Sodium: 134 mmol/L — ABNORMAL LOW (ref 135–145)
Sodium: 134 mmol/L — ABNORMAL LOW (ref 135–145)
Sodium: 134 mmol/L — ABNORMAL LOW (ref 135–145)
Sodium: 135 mmol/L (ref 135–145)
Sodium: 135 mmol/L (ref 135–145)
Sodium: 136 mmol/L (ref 135–145)
TCO2: 18 mmol/L — ABNORMAL LOW (ref 22–32)
TCO2: 20 mmol/L — ABNORMAL LOW (ref 22–32)
TCO2: 21 mmol/L — ABNORMAL LOW (ref 22–32)
TCO2: 21 mmol/L — ABNORMAL LOW (ref 22–32)
TCO2: 22 mmol/L (ref 22–32)
TCO2: 22 mmol/L (ref 22–32)
pCO2 arterial: 30.8 mm[Hg] — ABNORMAL LOW (ref 32–48)
pCO2 arterial: 31.1 mm[Hg] — ABNORMAL LOW (ref 32–48)
pCO2 arterial: 32 mm[Hg] (ref 32–48)
pCO2 arterial: 33 mm[Hg] (ref 32–48)
pCO2 arterial: 33.3 mm[Hg] (ref 32–48)
pCO2 arterial: 38.7 mm[Hg] (ref 32–48)
pH, Arterial: 7.322 — ABNORMAL LOW (ref 7.35–7.45)
pH, Arterial: 7.351 (ref 7.35–7.45)
pH, Arterial: 7.386 (ref 7.35–7.45)
pH, Arterial: 7.404 (ref 7.35–7.45)
pH, Arterial: 7.416 (ref 7.35–7.45)
pH, Arterial: 7.416 (ref 7.35–7.45)
pO2, Arterial: 115 mm[Hg] — ABNORMAL HIGH (ref 83–108)
pO2, Arterial: 137 mm[Hg] — ABNORMAL HIGH (ref 83–108)
pO2, Arterial: 145 mm[Hg] — ABNORMAL HIGH (ref 83–108)
pO2, Arterial: 211 mm[Hg] — ABNORMAL HIGH (ref 83–108)
pO2, Arterial: 229 mm[Hg] — ABNORMAL HIGH (ref 83–108)
pO2, Arterial: 44 mm[Hg] — ABNORMAL LOW (ref 83–108)

## 2023-10-16 LAB — MAGNESIUM: Magnesium: 2.5 mg/dL — ABNORMAL HIGH (ref 1.7–2.4)

## 2023-10-16 LAB — LACTATE DEHYDROGENASE
LDH: >2500 U/L — ABNORMAL HIGH (ref 98–192)
LDH: >2500 U/L — ABNORMAL HIGH (ref 98–192)

## 2023-10-16 LAB — RENAL FUNCTION PANEL
Albumin: 2.2 g/dL — ABNORMAL LOW (ref 3.5–5.0)
Anion gap: 18 — ABNORMAL HIGH (ref 5–15)
BUN: 10 mg/dL (ref 6–20)
CO2: 17 mmol/L — ABNORMAL LOW (ref 22–32)
Calcium: 7.7 mg/dL — ABNORMAL LOW (ref 8.9–10.3)
Chloride: 100 mmol/L (ref 98–111)
Creatinine, Ser: 1.87 mg/dL — ABNORMAL HIGH (ref 0.61–1.24)
GFR, Estimated: 42 mL/min — ABNORMAL LOW (ref >60)
Glucose, Bld: 84 mg/dL (ref 70–99)
Phosphorus: 6.4 mg/dL — ABNORMAL HIGH (ref 2.5–4.6)
Potassium: 4.9 mmol/L (ref 3.5–5.1)
Sodium: 135 mmol/L (ref 135–145)

## 2023-10-16 LAB — CBC
HCT: 26.9 % — ABNORMAL LOW (ref 39.0–52.0)
Hemoglobin: 8.8 g/dL — ABNORMAL LOW (ref 13.0–17.0)
MCH: 27.6 pg (ref 26.0–34.0)
MCHC: 32.7 g/dL (ref 30.0–36.0)
MCV: 84.3 fL (ref 80.0–100.0)
Platelets: 70 10*3/uL — ABNORMAL LOW (ref 150–400)
RBC: 3.19 MIL/uL — ABNORMAL LOW (ref 4.22–5.81)
RDW: 14.6 % (ref 11.5–15.5)
WBC: 5.6 10*3/uL (ref 4.0–10.5)
nRBC: 4.9 % — ABNORMAL HIGH (ref 0.0–0.2)

## 2023-10-16 LAB — HEPATIC FUNCTION PANEL
ALT: 1436 U/L — ABNORMAL HIGH (ref 0–44)
ALT: 1466 U/L — ABNORMAL HIGH (ref 0–44)
AST: >7000 U/L — ABNORMAL HIGH (ref 15–41)
AST: >7000 U/L — ABNORMAL HIGH (ref 15–41)
Albumin: 2.2 g/dL — ABNORMAL LOW (ref 3.5–5.0)
Albumin: 2.1 g/dL — ABNORMAL LOW (ref 3.5–5.0)
Alkaline Phosphatase: 90 U/L (ref 38–126)
Alkaline Phosphatase: 103 U/L (ref 38–126)
Bilirubin, Direct: 4 mg/dL — ABNORMAL HIGH (ref 0.0–0.2)
Bilirubin, Direct: 4.2 mg/dL — ABNORMAL HIGH (ref 0.0–0.2)
Indirect Bilirubin: 4.6 mg/dL — ABNORMAL HIGH (ref 0.3–0.9)
Indirect Bilirubin: 4.8 mg/dL — ABNORMAL HIGH (ref 0.3–0.9)
Total Bilirubin: 8.6 mg/dL — ABNORMAL HIGH (ref <1.2)
Total Bilirubin: 9 mg/dL — ABNORMAL HIGH (ref <1.2)
Total Protein: 4 g/dL — ABNORMAL LOW (ref 6.5–8.1)
Total Protein: 3.8 g/dL — ABNORMAL LOW (ref 6.5–8.1)

## 2023-10-16 LAB — CG4 I-STAT (LACTIC ACID)
Lactic Acid, Venous: 12.8 mmol/L (ref 0.5–1.9)
Lactic Acid, Venous: 13.5 mmol/L (ref 0.5–1.9)
Lactic Acid, Venous: 14.7 mmol/L (ref 0.5–1.9)
Lactic Acid, Venous: >15 mmol/L (ref 0.5–1.9)

## 2023-10-16 LAB — CK TOTAL AND CKMB (NOT AT ARMC)
CK, MB: 119.5 ng/mL — ABNORMAL HIGH (ref 0.5–5.0)
Total CK: 12088 U/L — ABNORMAL HIGH (ref 49–397)

## 2023-10-16 LAB — CK: Total CK: 14191 U/L — ABNORMAL HIGH (ref 49–397)

## 2023-10-16 LAB — COOXEMETRY PANEL
Carboxyhemoglobin: 2.4 % — ABNORMAL HIGH (ref 0.5–1.5)
Methemoglobin: <0.7 % (ref 0.0–1.5)
O2 Saturation: 82.1 %
Total hemoglobin: 8.9 g/dL — ABNORMAL LOW (ref 12.0–16.0)

## 2023-10-16 LAB — APTT: aPTT: 67 s — ABNORMAL HIGH (ref 24–36)

## 2023-10-16 LAB — GLUCOSE, CAPILLARY
Glucose-Capillary: 106 mg/dL — ABNORMAL HIGH (ref 70–99)
Glucose-Capillary: 87 mg/dL (ref 70–99)
Glucose-Capillary: 89 mg/dL (ref 70–99)
Glucose-Capillary: 68 mg/dL — ABNORMAL LOW (ref 70–99)
Glucose-Capillary: 124 mg/dL — ABNORMAL HIGH (ref 70–99)

## 2023-10-16 LAB — PROTIME-INR
INR: 3.1 — ABNORMAL HIGH (ref 0.8–1.2)
Prothrombin Time: 31.8 s — ABNORMAL HIGH (ref 11.4–15.2)

## 2023-10-16 LAB — FIBRINOGEN: Fibrinogen: 384 mg/dL (ref 210–475)

## 2023-10-16 MED ORDER — ARTIFICIAL TEARS OPHTHALMIC OINT: TOPICAL_OINTMENT | OPHTHALMIC | Status: DC | PRN

## 2023-10-16 MED ORDER — ROCURONIUM BROMIDE 10 MG/ML (PF) SYRINGE: 100.0000 mg | PREFILLED_SYRINGE | Freq: Once | INTRAVENOUS | Status: DC

## 2023-10-16 MED ORDER — ROCURONIUM BROMIDE 10 MG/ML (PF) SYRINGE
PREFILLED_SYRINGE | INTRAVENOUS | Status: AC
  Filled 2023-10-16: qty 10

## 2023-10-16 MED ORDER — DEXTROSE 10 % IV SOLN: INTRAVENOUS | Status: DC

## 2023-10-16 MED ORDER — SODIUM BICARBONATE 8.4 % IV SOLN
INTRAVENOUS | Status: AC
  Filled 2023-10-16: qty 50

## 2023-10-16 MED ORDER — STERILE WATER FOR INJECTION IV SOLN
INTRAVENOUS | Status: DC
  Filled 2023-10-16 (×2): qty 150

## 2023-10-16 MED ORDER — EPINEPHRINE 1 MG/10ML IJ SOSY
PREFILLED_SYRINGE | INTRAMUSCULAR | Status: AC
  Filled 2023-10-16: qty 20

## 2023-10-16 MED ORDER — SODIUM BICARBONATE 8.4 % IV SOLN
100.0000 meq | Freq: Once | INTRAVENOUS | Status: AC
  Administered 2023-10-16: 100 meq via INTRAVENOUS

## 2023-10-17 LAB — TYPE AND SCREEN
ABO/RH(D): O POS
Antibody Screen: NEGATIVE
Unit division: 0
Unit division: 0
Unit division: 0
Unit division: 0
Unit division: 0
Unit division: 0
Unit division: 0
Unit division: 0
Unit division: 0
Unit division: 0
Unit division: 0
Unit division: 0
Unit division: 0
Unit division: 0
Unit division: 0
Unit division: 0

## 2023-10-17 LAB — BPAM RBC
Blood Product Expiration Date: 202501012359
Blood Product Expiration Date: 202501032359
Blood Product Expiration Date: 202501032359
Blood Product Expiration Date: 202501032359
Blood Product Expiration Date: 202501032359
Blood Product Expiration Date: 202501072359
Blood Product Expiration Date: 202501112359
Blood Product Expiration Date: 202501112359
Blood Product Expiration Date: 202501112359
Blood Product Expiration Date: 202501112359
Blood Product Unit Number: 202501012359
Blood Product Unit Number: 202501062359
Blood Product Unit Number: 202501072359
ISSUE DATE / TIME: 202412150916
ISSUE DATE / TIME: 202412152224
ISSUE DATE / TIME: 202412160803
ISSUE DATE / TIME: 202412160953
ISSUE DATE / TIME: 202412161935
ISSUE DATE / TIME: 202412172227
ISSUE DATE / TIME: 202412180231
ISSUE DATE / TIME: 202501012359
ISSUE DATE / TIME: 202501072359
PRODUCT CODE: 202412172349
PRODUCT CODE: 202412180830
PRODUCT CODE: 202501032359
PRODUCT CODE: 202501032359
PRODUCT CODE: 202501062359
PRODUCT CODE: 202501072359
PRODUCT CODE: 202501072359
Unit Type and Rh: 202412181112
Unit Type and Rh: 202412181141
Unit Type and Rh: 202501012359
Unit Type and Rh: 202501012359
Unit Type and Rh: 202501062359
Unit Type and Rh: 202501072359
Unit Type and Rh: 5100
Unit Type and Rh: 5100
Unit Type and Rh: 5100
Unit Type and Rh: 5100
Unit Type and Rh: 5100
Unit Type and Rh: 5100
Unit Type and Rh: 5100
Unit Type and Rh: 5100
Unit Type and Rh: 5100
Unit Type and Rh: 5100
Unit Type and Rh: 5100
Unit Type and Rh: 5100
Unit Type and Rh: 5100
Unit Type and Rh: 5100
Unit Type and Rh: 5100
Unit Type and Rh: 5100
Unit Type and Rh: 5100
Unit Type and Rh: 5100

## 2023-10-24 LAB — LIDOCAINE LEVEL

## 2023-10-26 MED FILL — Medication: Qty: 1 | Status: AC

## 2023-10-30 NOTE — Progress Notes (Signed)
Nutrition Brief Note  Chart reviewed. Pt is DNR with plans for comfort care once all family arrives.  No nutrition interventions planned at this time.  Please re-consult RD if needed.   Romelle Starcher MS, RDN, LDN, CNSC Registered Dietitian 3 Clinical Nutrition RD Inpatient Contact Info in Amion

## 2023-10-30 NOTE — Death Summary Note (Signed)
Advanced Heart Failure Death Summary  Death Summary   Patient IDEndre [name] MRN: [id number], DOB/AGE: 05-18-1966 58 y.o. Admit date: 10/13/2023 D/C date:     10/27/2023   Primary Discharge Diagnoses:  VT/VF arrest Acute anterior MI Cardiogenic shock Acute hypoxic respiratory failure Acute blood loss anemia AKI Shock liver  Hospital Course:  58 y.o. [place] male with history of CAD, HLD, prior tobacco use ischemic cardiomyopathy.  Had inferior STEMI in 11/16 with DES to RCA. Two days later, he then underwent staged PCI with DES to the proximal and distal LAD for 90% and 70% stenosis respectively. Echo thereafter was notable for EF 45-50%, grade 1 diastolic dysfunction, mild LVH.    Lost to f/u until 8/19 when he presented with inferior STEMI. Cath showed very late stent thrombosis of the distal RCA stent.  Distal RCA was stented again after thrombectomy.   Presented 10/13/23 with acute anterior MI and recurrent VT/VF arrest c/b cardiogenic shock, AKI and shock liver. Placed on VA ECMO with Impella vent. Cath with acute 100% occlusion of LAD and CTO of RCA. Underwent PCI/DES of LAD. Post PCI had prolonged 30 mins VF arrest and supported on ECMO. Eventually stabilized.    Once in ICU, developed L groin bleed with near loss of low on ECMO. He was started on CRRT by Nephrology to clear acidosis. Had recurrent VF w/ sinus brady on 12/17 while on amio/lidocaine. Underwent temp wire placement. Overnight into 12/18, developed worsening lactic acidosis w/ rising markers of cell death including AST, CK and LDH. Concern for ischemic bowel and progressive vasoplegia. Lactic acid > 15 and became hypotensive w/ rising ECMO flows and pressor requirement. D/t rapidly progressing MSOF on maximal support it became clear that there was no chance of recovery. After discussion with family, he was transitioned to comfort measures. ECMO circuit was clamped and he was extubated. Patient passed with family at  bedside on 10/24/2023 at 12:25 PM.     Significant Diagnostic Studies  [facility] w/ ECMO cannulation, 10/13/23 Conclusion   Ost 1st Mrg to 1st Mrg lesion is 90% stenosed.   Mid RCA to Dist RCA lesion is 100% stenosed.   Prox RCA to Mid RCA lesion is 100% stenosed.   Prox LAD to Mid LAD lesion is 50% stenosed.   Dist LAD lesion is 60% stenosed.   Mid LAD to Dist LAD lesion is 60% stenosed.   Prox LAD lesion is 100% stenosed.   A drug-eluting stent was successfully placed using a SYNERGY XD 3.0X16.   Post intervention, there is a 0% residual stenosis.   Post intervention, there is a 0% residual stenosis.   Acute anterior STEMI Cardiogenic shock Placement of ECMO for support Thrombotic occlusion of the mid LAD before the old stent.  Successful PTCA/DES x 1 mid LAD Small Circumflex with chronic severe stenosis in the small obtuse marginal branch.  Large dominant RCA with chronic mid occlusion. The distal vessel fills from left to right collaterals post reperfusion of the LAD.    Recommendations: He  was loaded with Brilinta 180 mg in the cath lab. He was given ASA 324 mg x 1. He will be placed on Angiomax drip per the CHF team with ECMO in place. Will plan to continue DAPT with ASA and Brilinta for one year. High intensity statin. Management of cardiogenic shock/ECMO per Advanced Heart Failure team.     Consultations  PCCM Nephrology Palliative Care  Duration of Discharge Encounter: Greater than 35 minutes   Signed,  Marciana Uplinger N, PA-C 09/29/2023, 3:02 PM

## 2023-10-30 NOTE — Progress Notes (Signed)
50 mL fentanyl and 45 mL versed wasted in Stericycle with Devota Pace RN as witness.

## 2023-10-30 NOTE — Progress Notes (Signed)
Pt very tenuous overnight. LA consistently climbing, from 9.6>14.7. NaHCO3 persistently dropping despite 4 amps pushed & gtt started. Nephro contacted, NaHCO3 added to pre & post fluid on CRRT. CBGs trending down despite dextrose pushes & IVF containing D5W. Ionized Ca continues to downtrend despite 3 amps given total. Impella with frequent suction alarms. Flows decreased on VA ECMO 4.3>3.8. Fluid removal on CRRT halted to 0; only filtration at present. Despite this, pt has required 1 u PRBC & 4 albumins. Significant increase in vasopressor requirement for BP support. Very concerning clinical picture suggestive of mesenteric ischemia. Dr. Gala Romney replayed of all of above--in agreement with grim prognosis. Son at bedside, Caleb [name], made aware. This ES & bedside RN requested that he call his family to be present for AM rounds. Son updated on severity of illness; all questions answered. Overnight chaplain contacted w/ request of having daytime staff present for family during ECMO rounds. Attempted to contact [place] interpreter x2--left voicemail.

## 2023-10-30 NOTE — Progress Notes (Signed)
PHARMACY - ANTICOAGULATION CONSULT NOTE  Pharmacy Consult for bivalirudin  Indication:  ECMO/Impella CP  Allergies  Allergen Reactions   Lisinopril Rash         Patient Measurements: Height: 5\' 1"  (154.9 cm) Weight: 82.6 kg (182 lb 1.6 oz) IBW/kg (Calculated) : 52.3 Heparin Dosing Weight: 67.5 kg  Vital Signs: Temp: 97.9 F (36.6 C) (12/18 0300) Temp Source: Core (12/18 0208) BP: 80/76 (12/17 2355) Pulse Rate: 80 (12/18 0208)  Labs: Recent Labs    10/14/23 0421 10/14/23 0615 10/15/23 0416 10/15/23 0604 10/15/23 1040 10/15/23 1333 10/15/23 1554 10/15/23 1602 10/15/23 1901 10/15/23 1952 10/15/23 1953 10/15/23 2127 10/02/2023 0036 10/07/2023 0323 10/21/2023 0331  HGB 8.4*   < > 8.9*   < >  --    < > 8.1*   < >  --    < > 8.2*   < > 7.1* 8.8* 8.2*  HCT 25.1*   < > 27.2*   < >  --    < > 25.2*   < >  --    < > 25.2*   < > 21.0* 26.9* 24.0*  PLT 142*   < > 106*  --   --   --  82*  --   --   --  75*  --   --  70*  --   APTT  --    < > 36  --  73*  --   --   --  71*  --   --   --   --  67*  --   LABPROT 21.2*  --  27.1*  --   --   --   --   --   --   --   --   --   --  31.8*  --   INR 1.8*  --  2.5*  --   --   --   --   --   --   --   --   --   --  3.1*  --   CREATININE 2.09*   < > 1.74*  --   --   --  2.08*  --   --   --   --   --   --  1.87*  --    < > = values in this interval not displayed.    Estimated Creatinine Clearance: 40.2 mL/min (A) (by C-G formula based on SCr of 1.87 mg/dL (H)).   Medical History: Past Medical History:  Diagnosis Date   CAD (coronary artery disease) 09/03/2015   Inf STEMI 2016 tx with DES to RCA and staged PCI with DES x 2 to LAD // Inf STEMI 8/19 - 2/2 stent thrombosis - LHC:  LAD stents patent, OM1 90, dRCA 100 >> PCI:  Thrombectomy and DES to dRCA   Coronary artery disease    a. Inf STEMI 08/2015 s/p DES to RCA then staged DESx2 to prox and distal LAD;  residual 90% small OM1 which is being treated medically.   Former tobacco use     Hyperlipemia    Ischemic cardiomyopathy    a. 2D echo 09/02/15: mild LVH, EF 45-50%, mild HK of inferolateral myocardium, grade 1 DD, mild AI/MR.   ST elevation myocardial infarction (STEMI) of inferior wall (HCC) 08/31/2015    Medications:  Scheduled:   sodium chloride   Intravenous Once   aspirin  81 mg Per Tube Daily   busPIRone  15 mg Per Tube TID  Chlorhexidine Gluconate Cloth  6 each Topical Daily   docusate  100 mg Per Tube BID   fentaNYL  50 mcg Intravenous Once   insulin aspart  2-6 Units Subcutaneous Q4H   multivitamin  1 tablet Per Tube QHS   mouth rinse  15 mL Mouth Rinse Q2H   pantoprazole (PROTONIX) IV  40 mg Intravenous QHS   polyethylene glycol  17 g Per Tube Daily   sodium bicarbonate       sodium chloride flush  10 mL Intravenous Q12H   sodium chloride flush  3 mL Intravenous Q12H   ticagrelor  90 mg Per Tube BID    Assessment: 57 yom presenting as code STEMI complicated with multiple VF arrests (3 shocks) - now on [place] ECMO. LA 3.4, increasing to >15, now down to 11.4. Received DES to LAD - was on cangrelor prior to transitioning to brilinta. Also received 10,000 units of heparin bolus during procedure.  Anticoagulation was held initially with groin hematoma - remains stable s/p femstop. aPTT is supratherapeutic at 73, on bivalirudin@0 .015 mg/kg/hr. INR 2.5. Hgb 8.5, plt 106, fibrinogen 381. LDH increased to 1908. Stable fibrin strands in circuit. No bleeding or infusion issues. Impella running at P2 without any issues.   PM Update: aPTT remains slightly elevated at 71 for low goal of 60-65 on current rate of 0.12 mg/kg/hr. Platelets down at 75. Hgb low-stable at 8.2. Will reduce again.   Third shift update: aPTT remains slightly above goal at 67 on most recent check, targeting 60-65. As the aPTT is extremely close to the target, will continue at current rate. No signs of bleeding or issues with bivalrudin infusion noted.   Goal of Therapy:  aPTT 60-65 sec  Monitor  platelets by anticoagulation protocol: Yes   Plan:  Continue bivalirudin at 0.0115 mg/kg/hr (using weight of 72.6kg)  Draw aPTT in 6hr then aPTT/CBC q12h once stable in goal range  Monitor s/s bleeidng   Jani Gravel, PharmD Clinical Pharmacist  10/14/2023 4:54 AM

## 2023-10-30 NOTE — Procedures (Signed)
Extubation Procedure Note  Patient Details:   Name: Javonta Weatherwax DOB: 1966/05/18 MRN: [id number]   Airway Documentation:    Vent end date: 10/27/2023 Vent end time: 1225   Evaluation  O2 sats: currently acceptable Complications: No apparent complications Patient did tolerate procedure well.    No Pt extubated per protocol/family  Mariachristina Holle V 10/17/2023, 12:46 PM

## 2023-10-30 NOTE — Progress Notes (Signed)
Advanced Heart Failure Rounding Note  PCP-Cardiologist: Tonny Bollman, MD   Subjective:     Cannulated with VA ECMO on 12/15 for VT/VF storm.  Temp wire placed yesterday for recurrent VF with improvement. Unfortunately rising lactic acid overnight as well as markers of cell death including AST, CK, LDH. Exam consistent with ischemic bowel. Became hypotensive despite increasing ECMO flows this morning, lactic acid greater than 15. Discussed with family at bedside, will transition to comfort care when more family is able to arrive.   Objective:   Weight Range: 82.6 kg Body mass index is 34.41 kg/m.   Vital Signs:   Temp:  [97.2 F (36.2 C)-98.2 F (36.8 C)] 97.3 F (36.3 C) (12/18 0845) Pulse Rate:  [0-83] 80 (12/18 0800) Resp:  [8-43] 20 (12/18 1015) BP: (46-109)/(23-86) 80/76 (12/17 2355) SpO2:  [100 %] 100 % (12/18 0208) Arterial Line BP: (59-86)/(53-78) 59/53 (12/18 0830) FiO2 (%):  [50 %-100 %] 50 % (12/18 0803) Last BM Date : 10/13/23  Weight change: Filed Weights   10/13/23 0900 10/15/23 0500  Weight: 72.6 kg 82.6 kg    Intake/Output:   Intake/Output Summary (Last 24 hours) at 10/27/2023 1035 Last data filed at 09/29/2023 1000 Gross per 24 hour  Intake 5621.16 ml  Output 3792 ml  Net 1829.16 ml      Physical Exam    General: Ill-appearing, intubated HEENT: NCAT, swollen Cor: Tachycardic, cannulated on peripheral VA ECMO with right femoral vein venous drainage, left femoral arterial return, right common femoral Impella with bilateral distal perfusion catheters Lungs: Ventilated breath sounds Abdomen: increased distension, faint bowel sounds, ischemic gut smell Extremities: improving swelling of the left lower extremity, doppler flow in both lower extremities Neuro: Sedated   Patient Profile   Patient is a 58 year old male with a past medical history of coronary artery disease and poor medical follow-up who presented with acute LAD infarct  complicated by ventricular fibrillation leading to SCAI E shock requiring [facility] ECMO placement.  Assessment/Plan   Refractory VT/VF arrest in setting of acute MI 12/15: Initially defibrillated > 30 times, currently on amiodarone and lidocaine, improving initially but required temp wire placement for R on T VF, overdrive paced at 80. -Continue amio, though transition to comfort care support - continue ECMO support - Keep K> 4 MG > 2   Acute systolic HF with Cardiogenic shock, SCAI E:  ECPR with VA ECMO cannulation 12/15, Impella vent. Unfortunately developing ischemic bowel, progressive vasoplegia, and is already on maximal support. Will transition to comfort care after family is able to visit. - Bedside echo 12/17 EF 15-20% RV severely HK - see ECMO circuit note   CAD with acute anterior MI (NSTEMI) - h/o LAD and RCA stents - s/p PCI/DES to occluded LAD on 12/15. RCA CTO - Continue DAPT/statin   Acute hypoxic respiratory failure - due to cardiac arrest/pulmonary edema - on vent and VA ECMO - CCM co-managing   Acute blood loss anemia with L groin hematoma - pressure held. Fem stop in place - transfuse to keep Hgb > 8 g/dl   AKI - due to ATN - HD cath in place - Nephrology consulted - CRRT off with transition to comfort measures   Shock liver - supportive care   DM2 - insulin gtt   Noncompliance - has not followed up after previous Mis  Ischemic bowel: Rapidly rising lactic acid, worsening vasoplegia.    Length of Stay: 3  [name], MD  10/05/2023, 10:35 AM  Advanced Heart Failure Team Pager [phone] (M-F; 7a - 5p)  Please contact [facility] for night-coverage after hours (5p -7a ) and weekends on [email or web address]  CRITICAL CARE Performed by: [name]   Total critical care time: 70 minutes  Critical care time was exclusive of separately billable procedures and treating other patients.  Critical care was necessary to treat or prevent imminent or  life-threatening deterioration.  Critical care was time spent personally by me on the following activities: development of treatment plan with patient and/or surrogate as well as nursing, discussions with consultants, evaluation of patient's response to treatment, examination of patient, obtaining history from patient or surrogate, ordering and performing treatments and interventions, ordering and review of laboratory studies, ordering and review of radiographic studies, pulse oximetry and re-evaluation of patient's condition.

## 2023-10-30 NOTE — Progress Notes (Signed)
ECMO NOTE:   Indication: Cardiogenic shock/refractory VF   Initial cannulation date: 10/13/23   ECMO type: VA ECMO   Dual lumen inflow/return cannula:   1) 25 FR RFV multi-stage venous drainage 2) 19 FR L CFA arterial return 3) R& L SFA distal perfusion catheters 4) R CFA Impella CP vent  Daily data:   VA ECMO  Flow 5.1L RPM 4089 Sweep  7L  Impella CP P-2 with 1.5L flow Waveforms ok Position confirmed under echo Impella position "unknown" alarm is a function of poor cardiac contractility   Labs:   ABG    Component Value Date/Time   PHART 7.416 10/04/2023 0852   PCO2ART 33.0 10/05/2023 0852   PO2ART 44 (L) 10/25/2023 0852   HCO3 21.3 10/29/2023 0852   TCO2 22 09/29/2023 0852   ACIDBASEDEF 3.0 (H) 10/28/2023 0852   O2SAT 82 10/24/2023 0852    Plan:  - Unfortunately decompensated overnight, most likely etiology is ischemic bowel. Rapidly increasing lactic acid, AST, CK. Becoming vasoplegic. Discussed with family at bedside, patient made DNR and will likely transition to comfort care when family is able to visit   D/w CCM at bedside  [name], MD  10:29 AM

## 2023-10-30 NOTE — Progress Notes (Signed)
Per MD Elwyn Lade, ECMO circuit was clamped out for withdrawal of care @ 1221.

## 2023-10-30 NOTE — Progress Notes (Signed)
Per Drs. Elwyn Lade and [place], no escalation of care and no need to perform further lab draws or medication administration. Will continue current measures until the rest of the family arrives and then will proceed with withdrawal of care.

## 2023-10-30 NOTE — Progress Notes (Signed)
400 mcg Fentanyl and 5 mg Versed bolused for withdrawal of care per Elwyn Lade MD. Patient expired with family, physician, ECMO specialist, and RN at bedside at 1225.

## 2023-10-30 NOTE — Progress Notes (Signed)
Caleb [name], MRN:  [id number], DOB:  28-Nov-1965, LOS: 3 ADMISSION DATE:  10/13/2023, CONSULTATION DATE:  10/13/23 REFERRING MD:  EDP, CHIEF COMPLAINT:  chest pain   History of Present Illness:  58 year old man w/ hx of CAD s/p PCI in 2019 presenting with crushing chest pain.  EKG with STEMI.  Pain was a little abnormal in character so taken for dissection protocol CTA which was negative.  In CT Vfib arrest x 3 with ROSC, brought emergently to cath lab with LUCAS compressions ongoing.  PCCM consulted to assist with management.  Pertinent  Medical History  CAD w/ prior STEMI and PCI Ischemic cardiomyopathy EF 45-50% back in 2016  Significant Hospital Events: Including procedures, antibiotic start and stop dates in addition to other pertinent events   12/15 admit, code, ECMO  Interim History / Subjective:  Again lactate went up  Vasopressor requirement increased to Levophed of 30 CK and AST are up consistent with ischemia  No more episodes of V. tach  Objective   Blood pressure (!) 80/76, pulse 80, temperature 98.1 F (36.7 C), resp. rate 20, height 5\' 1"  (1.549 m), weight 82.6 kg, SpO2 100%. PAP: (17-27)/(8-25) 25/22 CVP:  [9 mmHg-20 mmHg] 20 mmHg  Vent Mode: PCV FiO2 (%):  [50 %-100 %] 50 % Set Rate:  [20 bmp] 20 bmp PEEP:  [6 cmH20-10 cmH20] 10 cmH20 Plateau Pressure:  [16 cmH20-23 cmH20] 23 cmH20   Intake/Output Summary (Last 24 hours) at 10/29/2023 0850 Last data filed at 10/05/2023 0700 Gross per 24 hour  Intake 5293.92 ml  Output 4331 ml  Net 962.92 ml   Filed Weights   10/13/23 0900 10/15/23 0500  Weight: 72.6 kg 82.6 kg    Examination: General: Crtitically ill-appearing male, orally intubated HEENT: [place]/AT, eyes anicteric.  ETT and OGT in place Neuro: Sedated, not following commands.  Eyes are closed.  Pupils 3 mm bilateral reactive to light Chest: Fine crackles bilaterally, no wheezes or rhonchi Heart: Regular rate and rhythm, no murmurs or  gallops Abdomen: Soft, nondistended, bowel sounds present Skin: Bluish discoloration of fingers bilaterally Extremities: ECMO cannula in place   Na 135, K 4.9, BUN 10, Cr 1.87, AST >7k,  ALT 1436, LDH >2.5k, CK >12k Lactate >15 7.41/33/44/82% Hemoglobin 8.9, WBC 10.4, platelet 106 A1c 6.4  Resolved Hospital Problem list   N/A  Assessment & Plan:  Acute STEMI, status post Vfib arrest on VA ECMO and Impella at P2 Sustained V. tach Acute on chronic biventricular heart failure with cardiogenic shock- s/p eCPR 10/13/23 Acute respiratory failure with hypoxia likely due to aspiration pneumonia Hx CAD with prior PCI Acute kidney injury due to ischemic ATN on CRRT Acute rhabdomyolysis Possible gut ischemia Shock liver Hypokalemia/hypophosphatemia/hypocalcemia, improving Lactic acidosis Anemia and thrombocytopenia critical illness Prediabetes with hyperglycemia Obesity  Advanced heart failure team is following Continue VA ECMO Patient has Impella at P3 with flow 1 L No more episodes of V. tach Continue lidocaine and amiodarone infusion Shock is status is getting worse, patient is requiring increasing vasopressors Epinephrine was stopped due to lactic acidosis Now patient MAP is dropping, his CK is elevated with elevated LDH and AST He underwent TVP yesterday Continue  norepinephrine and vasopressin with MAP goal 65 Continue aspirin and Brilinta Continue lung protective ventilation VAP prevention bundle in place Titrate FiO2 and PEEP with O2 sat goal 92% Continue IV antibiotic with vancomycin and meropenem PAD protocol with Versed and fentanyl CRRT was stopped due to worsening shock status  Discussed with patient's family that despite maximal medical therapy, he is declining and going into multisystem organ failure and from here on unfortunately there are no chances of recovery Patient's family understood.  Patient was made DNR, continue current care until family sees and  stated by  Best Practice (right click and "Reselect all SmartList Selections" daily)   Diet/type: NPO DVT prophylaxis bival Pressure ulcer(s): N/A GI prophylaxis: PPI Lines: Central line Foley:  Yes, and it is still needed Code Status:  DNR Last date of multidisciplinary goals of care discussion: Per primary team  Labs   CBC: Recent Labs  Lab 10/14/23 1559 10/14/23 1606 10/15/23 0416 10/15/23 0604 10/15/23 1554 10/15/23 1602 10/15/23 1953 10/15/23 2127 10/15/23 2313 10/22/2023 0036 10/18/2023 0323 10/23/2023 0331 10/06/2023 0521  WBC 7.2  --  10.4  --  7.3  --  5.9  --   --   --  5.6  --   --   HGB 7.8*   < > 8.9*   < > 8.1*   < > 8.2*   < > 7.6* 7.1* 8.8* 8.2* 7.5*  HCT 23.8*   < > 27.2*   < > 25.2*   < > 25.2*   < > 23.9* 21.0* 26.9* 24.0* 22.0*  MCV 81.2  --  80.5  --  82.9  --  82.4  --   --   --  84.3  --   --   PLT 108*  --  106*  --  82*  --  75*  --   --   --  70*  --   --    < > = values in this interval not displayed.    Basic Metabolic Panel: Recent Labs  Lab 10/14/23 0421 10/14/23 0617 10/14/23 1600 10/14/23 1606 10/15/23 0413 10/15/23 0416 10/15/23 0604 10/15/23 1554 10/15/23 1602 10/15/23 2306 10/14/2023 0036 10/23/2023 0323 09/29/2023 0331 09/30/2023 0521  NA 139   < > 137   < > 133*  132* 133*   < > 133*   < > 135 135 135 134* 136  K 3.2*   < > 4.0   < > 5.7*  5.7* 5.8*   < > 5.7*   < > 5.1 5.2* 4.9 4.9 4.7  CL 101  --  101  --  97* 101  --  100  --   --   --  100  --   --   CO2 24  --  26  --   --  22  --  19*  --   --   --  17*  --   --   GLUCOSE 145*  --  99  --  110* 108*  --  77  --   --   --  84  --   --   BUN 15  --  11  --  9 10  --  11  --   --   --  10  --   --   CREATININE 2.09*  --  1.61*  --  1.70* 1.74*  --  2.08*  --   --   --  1.87*  --   --   CALCIUM 7.1*  --  7.2*  --   --  7.0*  --  7.1*  --   --   --  7.7*  --   --   MG 2.3  --   --   --   --  2.2  --   --   --   --   --  2.5*  --   --   PHOS 1.0*  --  4.5  --   --  4.8*  --  7.3*   --   --   --  6.4*  --   --    < > = values in this interval not displayed.   GFR: Estimated Creatinine Clearance: 40.2 mL/min (A) (by C-G formula based on SCr of 1.87 mg/dL (H)). Recent Labs  Lab 10/15/23 0416 10/15/23 0429 10/15/23 1554 10/15/23 1617 10/15/23 1953 10/15/23 2306 09/29/2023 0157 09/30/2023 0323 10/18/2023 0332 10/08/2023 0521  WBC 10.4  --  7.3  --  5.9  --   --  5.6  --   --   LATICACIDVEN  --    < >  --    < > 9.3* 10.6* 12.8*  --  13.5* 14.7*   < > = values in this interval not displayed.    Liver Function Tests: Recent Labs  Lab 10/13/23 1338 10/14/23 0421 10/14/23 1600 10/15/23 0416 10/15/23 1554 10/09/2023 0323  AST 616* 612*  --  579*  --  >7,000*  ALT 135* 112*  --  217*  --  1,436*  ALKPHOS 73 47  --  66  --  90  BILITOT 1.1 1.6*  --  5.2*  --  8.6*  PROT 3.8* 3.7*  --  4.0*  --  4.0*  ALBUMIN 1.5* 2.2*  2.2* 2.0* 2.2*  2.2* 1.9* 2.2*  2.2*   No results for input(s): "LIPASE", "AMYLASE" in the last 168 hours. No results for input(s): "AMMONIA" in the last 168 hours.  ABG    Component Value Date/Time   PHART 7.416 09/29/2023 0521   PCO2ART 33.3 10/21/2023 0521   PO2ART 137 (H) 09/30/2023 0521   HCO3 21.5 10/14/2023 0521   TCO2 22 10/14/2023 0521   ACIDBASEDEF 3.0 (H) 10/10/2023 0521   O2SAT 99 10/20/2023 0521     Coagulation Profile: Recent Labs  Lab 10/13/23 1210 10/14/23 0421 10/15/23 0416 10/23/2023 0323  INR 1.8* 1.8* 2.5* 3.1*    Cardiac Enzymes: Recent Labs  Lab 09/29/2023 0513  CKTOTAL 12,088*  CKMB 119.5*    HbA1C: Hgb A1c MFr Bld  Date/Time Value Ref Range Status  10/14/2023 04:25 AM 6.4 (H) 4.8 - 5.6 % Final    Comment:    (NOTE) Pre diabetes:          5.7%-6.4%  Diabetes:              >6.4%  Glycemic control for   <7.0% adults with diabetes   06/07/2018 01:11 AM 5.8 (H) 4.8 - 5.6 % Final    Comment:    (NOTE) Pre diabetes:          5.7%-6.4% Diabetes:              >6.4% Glycemic control for    <7.0% adults with diabetes     CBG: Recent Labs  Lab 10/15/23 2329 10/28/2023 0154 10/09/2023 0331 10/17/2023 0440 10/09/2023 0755  GLUCAP 134* 106* 87 89 68*    The patient is critically ill due to acute STEMI/cardiac arrest/cardiogenic shock.  Critical care was necessary to treat or prevent imminent or life-threatening deterioration.  Critical care was time spent personally by me on the following activities: development of treatment plan with patient and/or surrogate as well as nursing, discussions with consultants, evaluation of patient's response to treatment, examination of patient, obtaining history from patient or surrogate, ordering and performing treatments and interventions, ordering and review  of laboratory studies, ordering and review of radiographic studies, pulse oximetry, re-evaluation of patient's condition and participation in multidisciplinary rounds.   During this encounter critical care time was devoted to patient care services described in this note for 49 minutes.     Cheri Fowler, MD [facility] See Amion for pager If no response to pager, please call [phone] until 7pm After 7pm, Please call E-link [phone]

## 2023-10-30 NOTE — Progress Notes (Signed)
Per pt's nurse request, Caleb [name] has made referral to day Chaplains to check in on this family as the first priority Wednesday morning and to be present for support at family meeting w/ physician.  Caleb [name] Chaplain

## 2023-10-30 NOTE — Procedures (Signed)
Admit: 10/13/2023 LOS: 3  67M with STEMI, refractory VF/VT, cardiogenic shock  Current CRRT Prescription: Start Date: 10/13/23 Catheter: L Fem Temp HD Cath placed 10/13/23 AHF BFR: 200 Pre Blood Pump: NaHCO3 gtt 11mL/h DFR: 1500 4K Replacement Rate: NaHCO3 @ 117mL/h Goal UF: Net positive Anticoagulation: systemic per AHF; angiomax Clotting: None since initiation of angiomax  S: Moved to NaHCO3 pre/post gtt overnight Climbing lactate, CK, and AST For CT this AM Remains on Epi/NE/VP/Lido/Amio Remains on VA ECMO and Impella  O: 12/17 0701 - 12/18 0700 In: 5469.1 [I.V.:3376.1; Blood:315; NG/GT:130; IV Piggyback:1304.1] Out: 4481 [Urine:15]  Filed Weights   10/13/23 0900 10/15/23 0500  Weight: 72.6 kg 82.6 kg    Recent Labs  Lab 10/15/23 0416 10/15/23 0604 10/15/23 1554 10/15/23 1602 10/14/2023 0323 10/29/2023 0331 10/04/2023 0521 10/06/2023 0758 10/07/2023 0852  NA 133*   < > 133*   < > 135   < > 136 135 134*  K 5.8*   < > 5.7*   < > 4.9   < > 4.7 4.4 5.1  CL 101  --  100  --  100  --   --   --   --   CO2 22  --  19*  --  17*  --   --   --   --   GLUCOSE 108*  --  77  --  84  --   --   --   --   BUN 10  --  11  --  10  --   --   --   --   CREATININE 1.74*  --  2.08*  --  1.87*  --   --   --   --   CALCIUM 7.0*  --  7.1*  --  7.7*  --   --   --   --   PHOS 4.8*  --  7.3*  --  6.4*  --   --   --   --    < > = values in this interval not displayed.   Recent Labs  Lab 10/15/23 1554 10/15/23 1602 10/15/23 1953 10/15/23 2127 10/20/2023 0323 10/27/2023 0331 10/28/2023 0521 10/21/2023 0758 10/22/2023 0852  WBC 7.3  --  5.9  --  5.6  --   --   --   --   HGB 8.1*   < > 8.2*   < > 8.8*   < > 7.5* 8.5* 8.8*  HCT 25.2*   < > 25.2*   < > 26.9*   < > 22.0* 25.0* 26.0*  MCV 82.9  --  82.4  --  84.3  --   --   --   --   PLT 82*  --  75*  --  70*  --   --   --   --    < > = values in this interval not displayed.    Scheduled Meds:  sodium chloride   Intravenous Once   aspirin  81  mg Per Tube Daily   busPIRone  15 mg Per Tube TID   Chlorhexidine Gluconate Cloth  6 each Topical Daily   docusate  100 mg Per Tube BID   fentaNYL  50 mcg Intravenous Once   insulin aspart  2-6 Units Subcutaneous Q4H   multivitamin  1 tablet Per Tube QHS   mouth rinse  15 mL Mouth Rinse Q2H   pantoprazole (PROTONIX) IV  40 mg Intravenous QHS   polyethylene glycol  17  g Per Tube Daily   sodium bicarbonate       sodium chloride flush  10 mL Intravenous Q12H   sodium chloride flush  3 mL Intravenous Q12H   ticagrelor  90 mg Per Tube BID   Continuous Infusions:  albumin human Stopped (10/19/2023 0457)   amiodarone 60 mg/hr (10/10/2023 0800)   bivalirudin (ANGIOMAX) 250 mg in sodium chloride 0.9 % 500 mL (0.5 mg/mL) infusion 0.0115 mg/kg/hr (10/19/2023 0800)   dextrose 50 mL/hr at 10/21/2023 0812   epinephrine 2 mcg/min (10/05/2023 0800)   fentaNYL infusion INTRAVENOUS 200 mcg/hr (10/08/2023 0800)   insulin Stopped (10/14/23 1610)   lidocaine Stopped (10/18/2023 0752)   meropenem (MERREM) IV 1 g (10/17/2023 0804)   midazolam 7 mg/hr (10/04/2023 0800)   norepinephrine (LEVOPHED) Adult infusion 27 mcg/min (10/05/2023 0800)   prismasol BGK 4/2.5 1,500 mL/hr at 10/18/2023 0552   sodium bicarbonate 150 mEq in dextrose 5 % 1,150 mL infusion 100 mL/hr at 09/29/2023 0800   sodium bicarbonate 150 mEq in sterile water 1,150 mL infusion 250 mL/hr at 10/03/2023 0442   sodium bicarbonate 150 mEq in sterile water 1,150 mL infusion 250 mL/hr at 10/24/2023 0445   sodium bicarbonate 25 mEq (Impella PURGE) in dextrose 5 % 1000 mL bag 200 mL/hr at 10/13/23 1046   vancomycin Stopped (10/15/23 2102)   vasopressin 0.04 Units/min (10/14/2023 0800)   PRN Meds:.albumin human, artificial tears, dextrose, fentaNYL, heparin, heparin, midazolam, mouth rinse, sodium bicarbonate, sodium chloride flush  ABG    Component Value Date/Time   PHART 7.416 10/09/2023 0852   PCO2ART 33.0 10/17/2023 0852   PO2ART 44 (L) 10/06/2023 0852   HCO3 21.3  10/24/2023 0852   TCO2 22 10/18/2023 0852   ACIDBASEDEF 3.0 (H) 10/01/2023 0852   O2SAT 82 10/29/2023 0852    A/P  Dialysis dependent AKI on CRRT Start 10/13/23 using L Fem Temp HD cath AKI 2/2 cardiogenic shock as below STEMI, cardiogenic shock, refractory VF/VT Hyperkalemia Hypophosphatemia Anemia Metabolic Acidosis, lactate Rhabdomyolysis  Some consideration of transition to comfort measures If CRRT to cont would transition back to prismasate for pre/post  Arita Miss, MD  [facility]

## 2023-10-30 NOTE — Progress Notes (Signed)
Chaplain paged to patient room to support family. Chaplain notified that patient quickly declined and medical team would not be performing any heroic measures should he continue to do so. Chaplain provided support to pt's spouse and 3 adult children as well as the pt's brother with the assistance of an interpreter. Pt's daughter is waiting for her husband to get here from a couple of hours away. Interpreter is familiar to family from local community and is also a good support. Provided hospitality and support. Will continue to follow.  Please page as further needs arise.  Maryanna Shape. Carley Hammed, M.Div. [facility] Chaplain Pager [phone] Office [phone]

## 2023-10-30 NOTE — Progress Notes (Signed)
Date and time results received: 10/18/2023 0811 Test: iSTAT lactic acid Critical Value: >15 Name of Provider Notified: Merrily Pew MD Orders Received? Or Actions Taken?: none, expected result

## 2023-10-30 DEATH — deceased
